# Patient Record
Sex: Male | Born: 1970 | Race: White | Hispanic: No | Marital: Married | Smoking: Never smoker
Health system: Southern US, Community
[De-identification: ages and names within clinical notes are randomized; demographics above are authoritative.]

## PROBLEM LIST (undated history)

## (undated) DIAGNOSIS — L509 Urticaria, unspecified: Secondary | ICD-10-CM

## (undated) DIAGNOSIS — K2970 Gastritis, unspecified, without bleeding: Secondary | ICD-10-CM

## (undated) DIAGNOSIS — K219 Gastro-esophageal reflux disease without esophagitis: Secondary | ICD-10-CM

## (undated) DIAGNOSIS — F419 Anxiety disorder, unspecified: Secondary | ICD-10-CM

## (undated) DIAGNOSIS — I422 Other hypertrophic cardiomyopathy: Secondary | ICD-10-CM

## (undated) HISTORY — DX: Gastro-esophageal reflux disease without esophagitis: K21.9

## (undated) HISTORY — PX: VASECTOMY: SHX75

## (undated) HISTORY — DX: Urticaria, unspecified: L50.9

## (undated) HISTORY — DX: Gastritis, unspecified, without bleeding: K29.70

## (undated) HISTORY — PX: APPENDECTOMY: SHX54

## (undated) HISTORY — DX: Anxiety disorder, unspecified: F41.9

## (undated) HISTORY — DX: Other hypertrophic cardiomyopathy: I42.2

---
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
## 2005-01-29 ENCOUNTER — Observation Stay (HOSPITAL_COMMUNITY): Admission: EM | Admit: 2005-01-29 | Discharge: 2005-01-29 | Payer: Self-pay | Admitting: Emergency Medicine

## 2005-02-04 ENCOUNTER — Encounter: Admission: RE | Admit: 2005-02-04 | Discharge: 2005-02-04 | Payer: Self-pay | Admitting: Internal Medicine

## 2005-06-06 ENCOUNTER — Ambulatory Visit (HOSPITAL_COMMUNITY): Admission: RE | Admit: 2005-06-06 | Discharge: 2005-06-06 | Payer: Self-pay | Admitting: Cardiology

## 2006-03-27 ENCOUNTER — Ambulatory Visit: Payer: Self-pay | Admitting: Cardiology

## 2006-04-09 ENCOUNTER — Ambulatory Visit: Payer: Self-pay

## 2006-04-09 ENCOUNTER — Encounter: Payer: Self-pay | Admitting: Cardiology

## 2006-04-23 ENCOUNTER — Ambulatory Visit: Payer: Self-pay | Admitting: Cardiology

## 2006-07-04 IMAGING — CT CT HEART WO/W CTA ONLY W/ CA
1 of 2 series · 7 of 20 positions shown, 9 images · IV contrast (APPLIED)
Comparison: none

CLINICAL DATA: The patient is a 37-year-old white male, who has had recurrent chest pain.  He has an abnormal ECG.  Previous stress echo study showed normal exam; however, he has continued to have symptoms of intermittent chest pain. 
CORONARY CT ANGIOGRAPHY WITH LEFT VENTRICULAR ASSESSMENT AND CALCIUM SCORING:
Preparation:  The patient was premedicated with Toprol XL 50 mg the day of the procedure.  He was given sublingual nitroglycerin x 1 prior to the scan.  His heart rate was 56 during the procedure.  
The scan was then performed.  Initial topogram was obtained.  We then performed a scan for calcium scoring.  The patient then received a 20 cc IV contrast timing bolus with sample obtained in the ascending aorta.  He then received an 80 cc bolus of contrast for CT angiography followed by saline flush.  The patient tolerated the procedure well without complications.  Scanning was performed on a Siemens Sensation 64-channel multislice CT scanner with 0.6-mm collimation and 0.4-mm image overlap reconstruction.  Data was reconstructed and viewed on [employer] workstation.  Image quality was excellent.
EXAMINATION FINDINGS:  Calcium score was zero, placing him at less than 75% for matched age and sex cohort.
Coronary angiography demonstrated normal origin of the left main coronary artery from the left coronary cusp.  The left main coronary artery was normal. 
The left anterior descending artery was a large vessel supplying the anterior wall.  It gives rise to a moderate-sized first diagonal and a somewhat smaller size second diagonal.  These branches were all normal with no evidence of atherosclerotic disease.  There is a segment of the mid LAD between the first and second diagonal branches that is intramyocardial.  
The left circumflex coronary artery gives rise to a high takeoff of a first marginal branch, which is a moderate to large vessel.  The circumflex then continues on the lateral wall to a second obtuse marginal vessel.  These branches are normal with no evidence of atherosclerotic disease. 
The right coronary artery arises normally from the right coronary cusp.  It is a very large and dominant vessel.  It has a high takeoff of the PDA, which courses to the AV groove.  The right coronary artery then terminates in smaller posterolateral branches.  The right coronary artery throughout its course and branches is normal without atherosclerotic disease noted. 
Left ventricular analysis was performed.  This demonstrates normal left ventricular size, wall thickness, and contractility with no regional wall motion abnormalities.  End-diastolic volume is 140 cc, and systolic volume is 47 cc with an ejection fraction of 67%.

[Series 6: soft tissue · axial · 0.62mm/px · z∈[+1151,+1250]mm · 7 of 45 slices shown, 9 images]
[im 6/45  vessel]
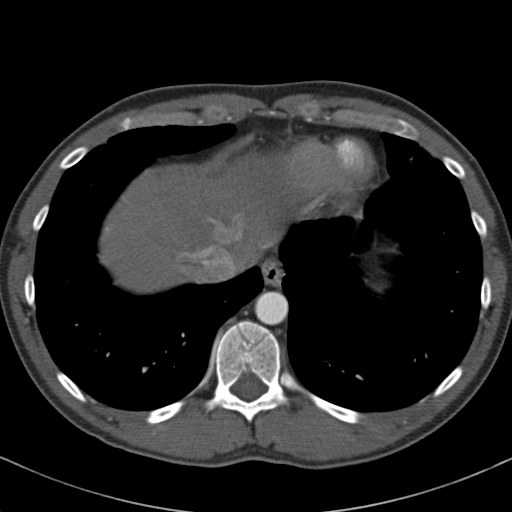
[im 6/45  lung]
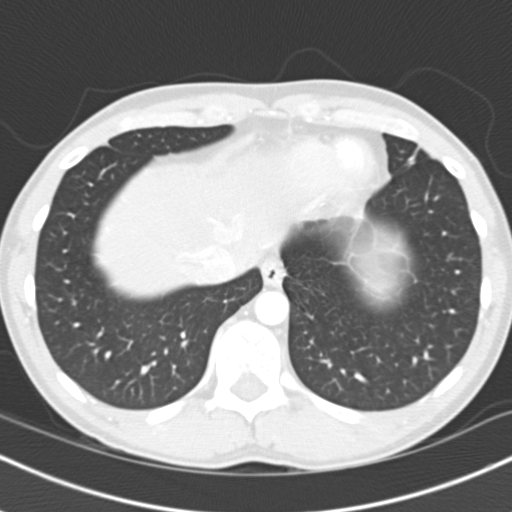
[im 12/45  vessel]
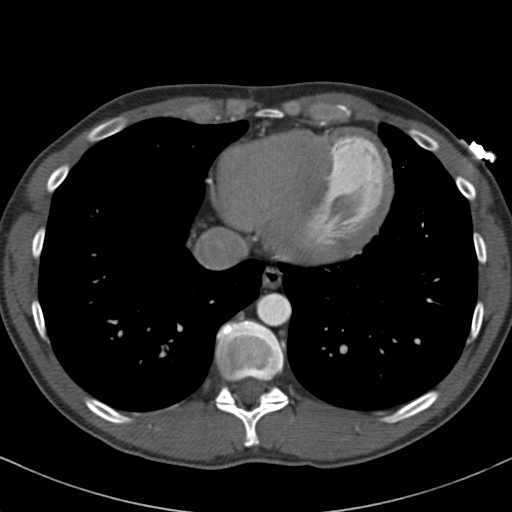
[im 17/45  vessel]
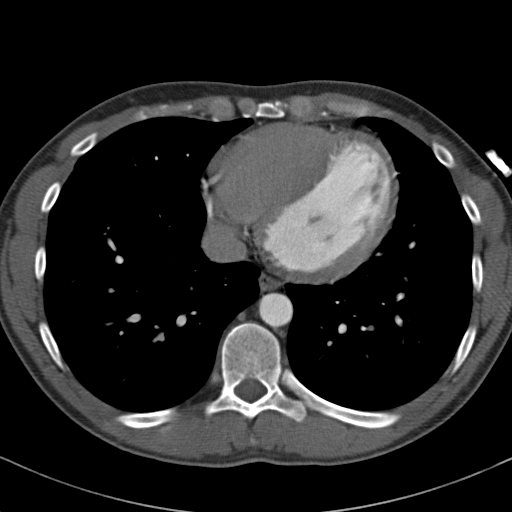
[im 23/45  vessel]
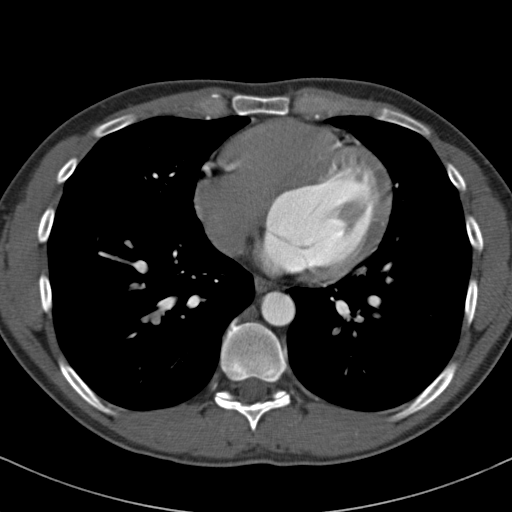
[im 28/45  vessel]
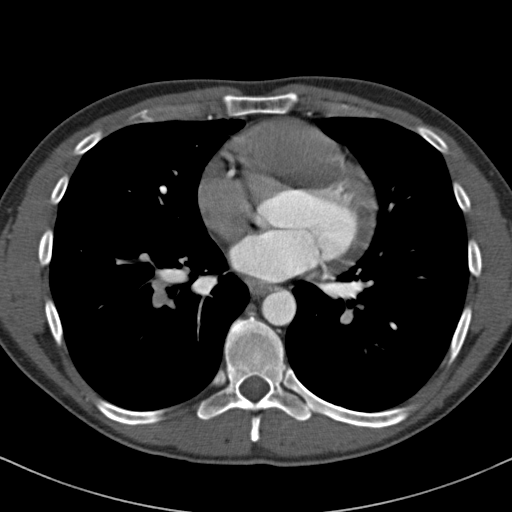
[im 28/45  lung]
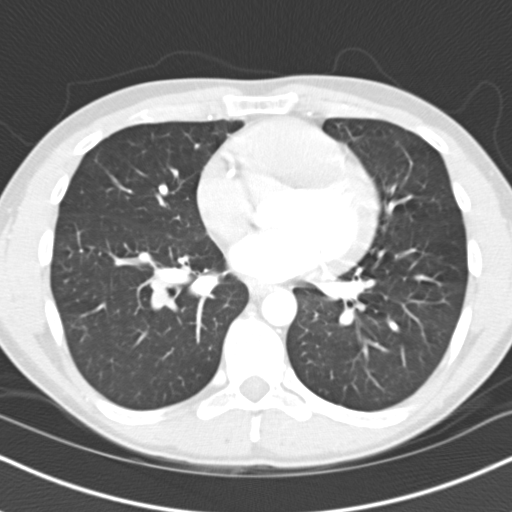
[im 34/45  vessel]
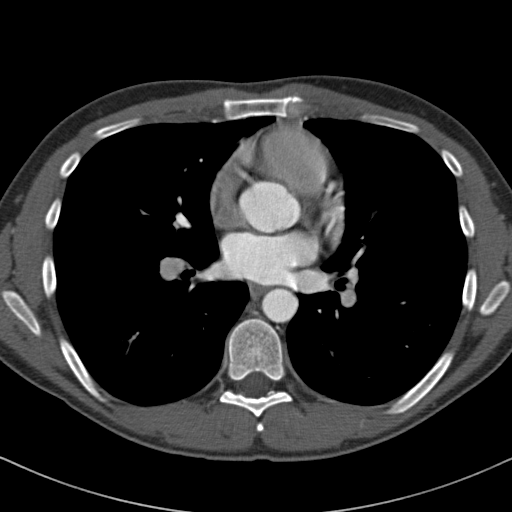
[im 39/45  vessel]
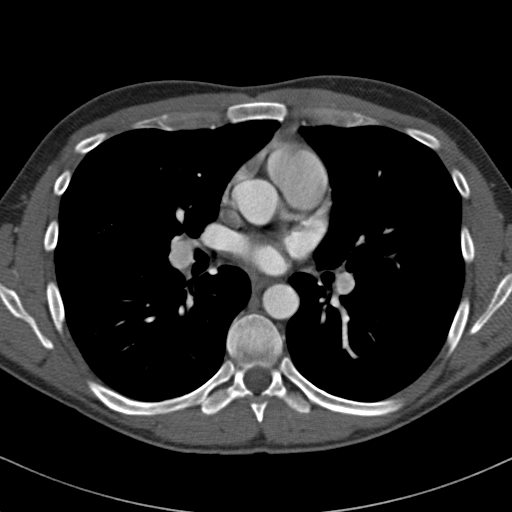

[7 of 20 positions shown; findings below may reference images not displayed]

IMPRESSION: 1.  Normal coronary anatomy with no evidence of atherosclerotic disease. 
2.  Intramyocardial segment of the mid LAD.
3.  Calcium score of zero. 
4.  Normal left ventricular function. 
5.   No significant extracardiac findings.
The study was performed by Sylfrain. Fraisse, Peeta, and David Freddy Nv.
[REDACTED] to Hohner, using codes 148T and 151T.

## 2006-08-25 ENCOUNTER — Ambulatory Visit: Payer: Self-pay | Admitting: Cardiology

## 2006-09-02 ENCOUNTER — Ambulatory Visit: Payer: Self-pay | Admitting: Gastroenterology

## 2006-09-02 LAB — CONVERTED CEMR LAB
AST: 26 units/L (ref 0–37)
Alkaline Phosphatase: 56 units/L (ref 39–117)
Basophils Absolute: 0 10*3/uL (ref 0.0–0.1)
Basophils Relative: 0.7 % (ref 0.0–1.0)
Bilirubin, Direct: 0.1 mg/dL (ref 0.0–0.3)
CO2: 31 meq/L (ref 19–32)
Calcium: 9.6 mg/dL (ref 8.4–10.5)
Chloride: 102 meq/L (ref 96–112)
GFR calc non Af Amer: 101 mL/min
Glucose, Bld: 97 mg/dL (ref 70–99)
HCT: 42.1 % (ref 39.0–52.0)
Lipase: 34 units/L (ref 11.0–59.0)
MCV: 88.5 fL (ref 78.0–100.0)
Monocytes Relative: 8.5 % (ref 3.0–11.0)
Neutrophils Relative %: 61.1 % (ref 43.0–77.0)
Platelets: 229 10*3/uL (ref 150–400)
Potassium: 3.9 meq/L (ref 3.5–5.1)
RBC: 4.76 M/uL (ref 4.22–5.81)
RDW: 11.9 % (ref 11.5–14.6)
Saturation Ratios: 15.7 % — ABNORMAL LOW (ref 20.0–50.0)
Sed Rate: 13 mm/hr (ref 0–20)
Sodium: 141 meq/L (ref 135–145)
Total Bilirubin: 0.6 mg/dL (ref 0.3–1.2)
WBC: 5 10*3/uL (ref 4.5–10.5)

## 2006-09-12 ENCOUNTER — Ambulatory Visit (HOSPITAL_COMMUNITY): Admission: RE | Admit: 2006-09-12 | Discharge: 2006-09-12 | Payer: Self-pay | Admitting: Gastroenterology

## 2006-09-15 ENCOUNTER — Encounter: Payer: Self-pay | Admitting: Gastroenterology

## 2006-09-15 ENCOUNTER — Ambulatory Visit: Payer: Self-pay | Admitting: Gastroenterology

## 2006-09-15 DIAGNOSIS — K2990 Gastroduodenitis, unspecified, without bleeding: Secondary | ICD-10-CM

## 2006-09-15 DIAGNOSIS — K2970 Gastritis, unspecified, without bleeding: Secondary | ICD-10-CM | POA: Insufficient documentation

## 2007-04-10 DIAGNOSIS — I498 Other specified cardiac arrhythmias: Secondary | ICD-10-CM

## 2007-05-05 ENCOUNTER — Ambulatory Visit: Payer: Self-pay | Admitting: Cardiology

## 2007-05-18 ENCOUNTER — Ambulatory Visit: Payer: Self-pay

## 2007-05-18 ENCOUNTER — Encounter: Payer: Self-pay | Admitting: Cardiology

## 2007-10-10 IMAGING — US US ABDOMEN COMPLETE
1 series · 14 of 25 positions shown · non-contrast
Comparison: None.

CLINICAL DATA: Abdominal pain.
 ABDOMEN ULTRASOUND:
TECHNIQUE: Complete abdominal ultrasound examination was performed including evaluation of the liver, gallbladder, bile ducts, pancreas, kidneys, spleen, IVC, and abdominal aorta.

[Series 1: unknown · 0.33mm/px · 14 of 59 slices shown]
[im 1/59]
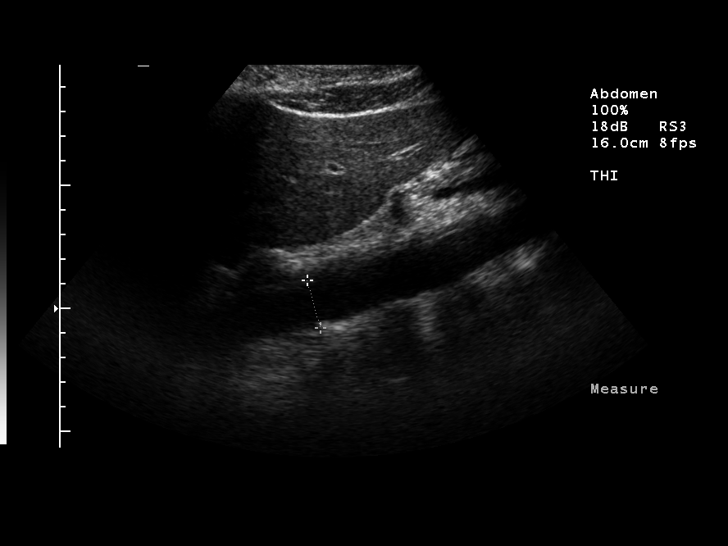
[im 5/59]
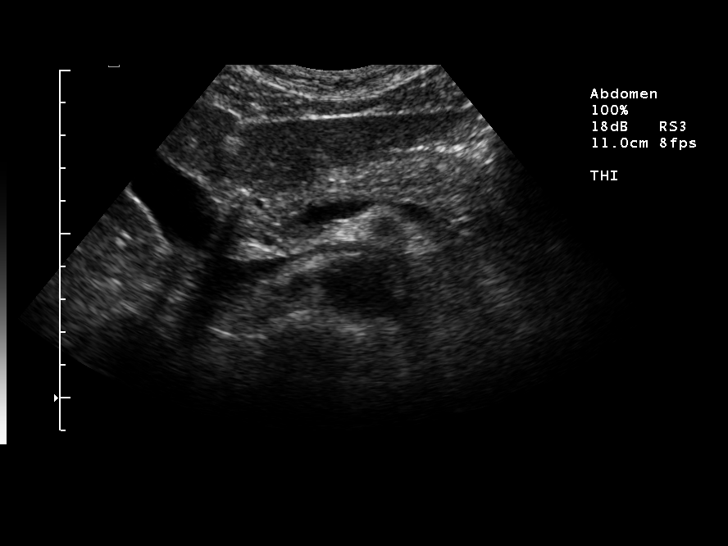
[im 10/59]
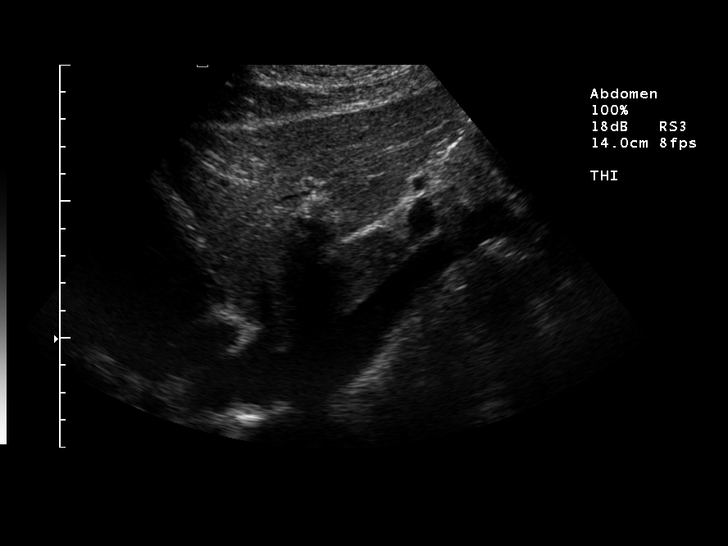
[im 15/59]
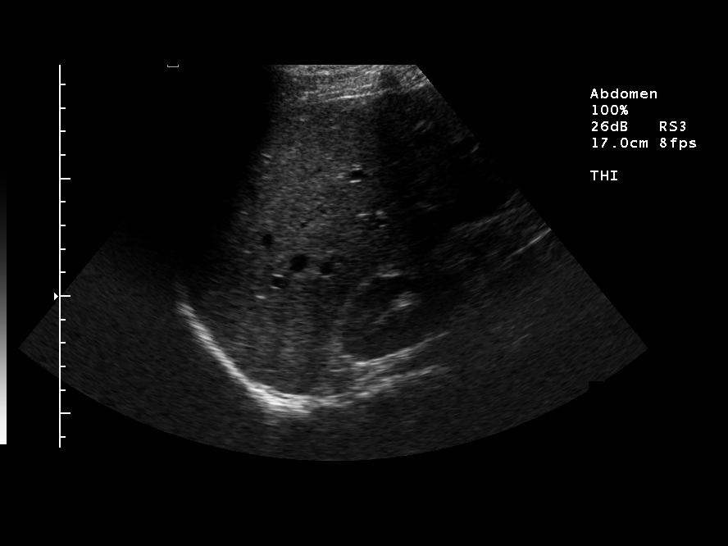
[im 20/59]
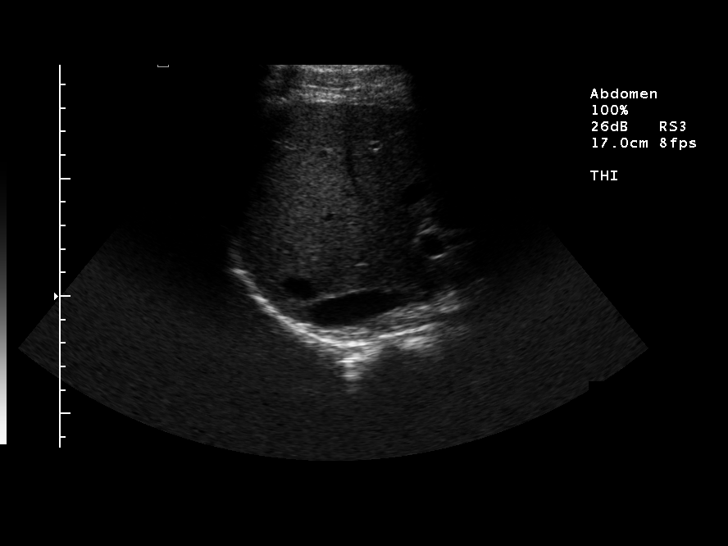
[im 22/59]
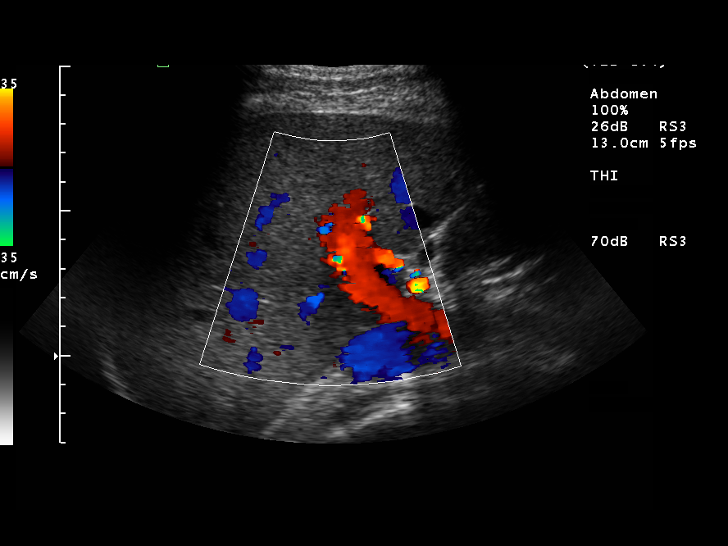
[im 27/59]
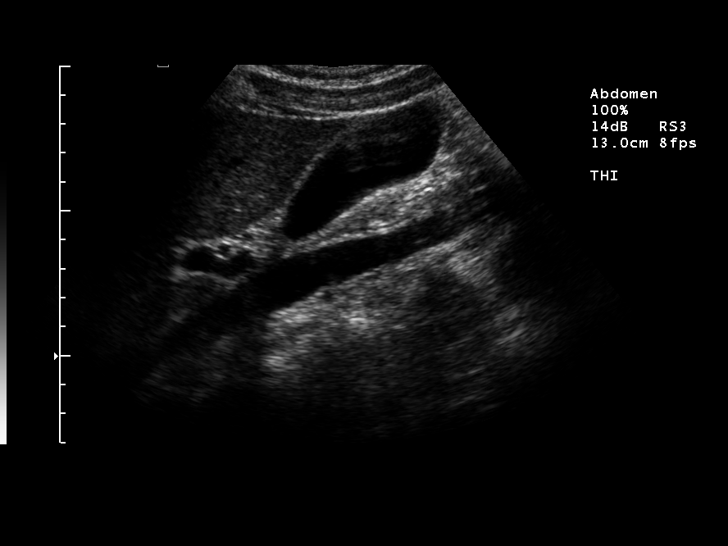
[im 32/59]
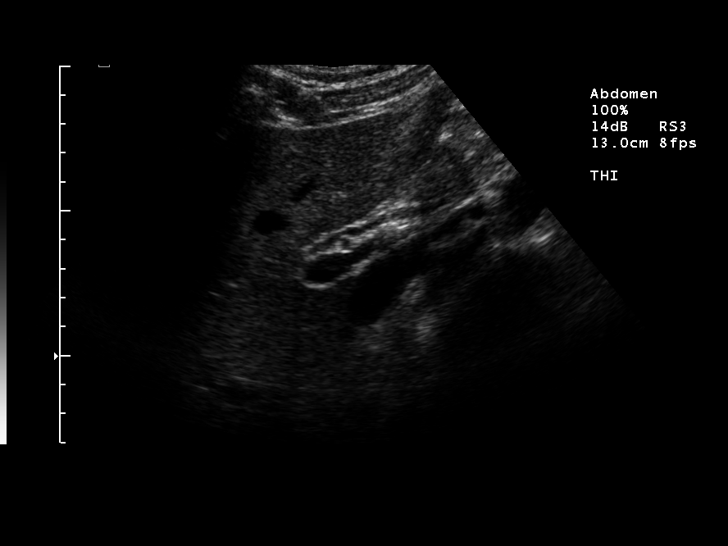
[im 37/59]
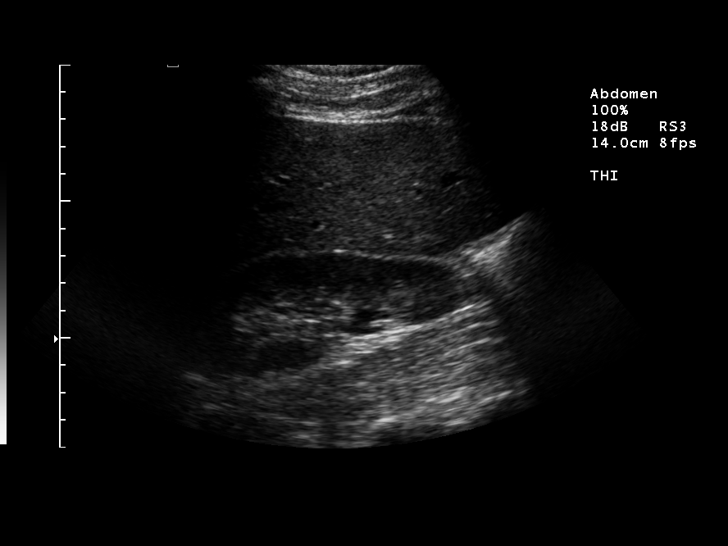
[im 39/59]
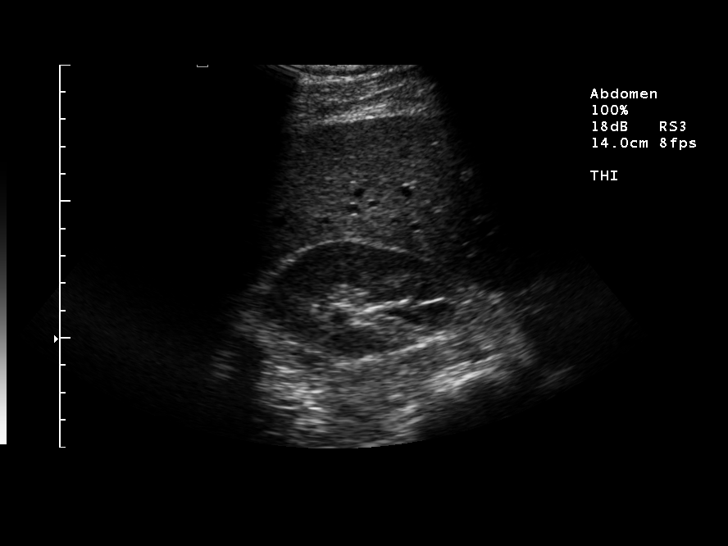
[im 44/59]
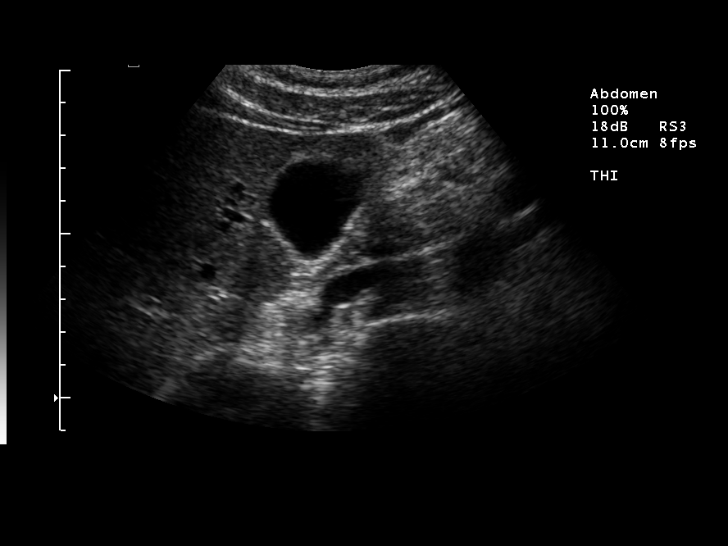
[im 49/59]
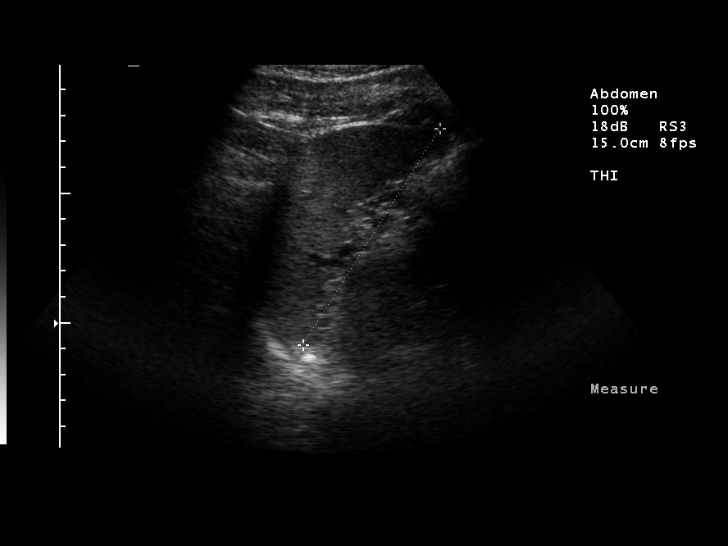
[im 54/59]
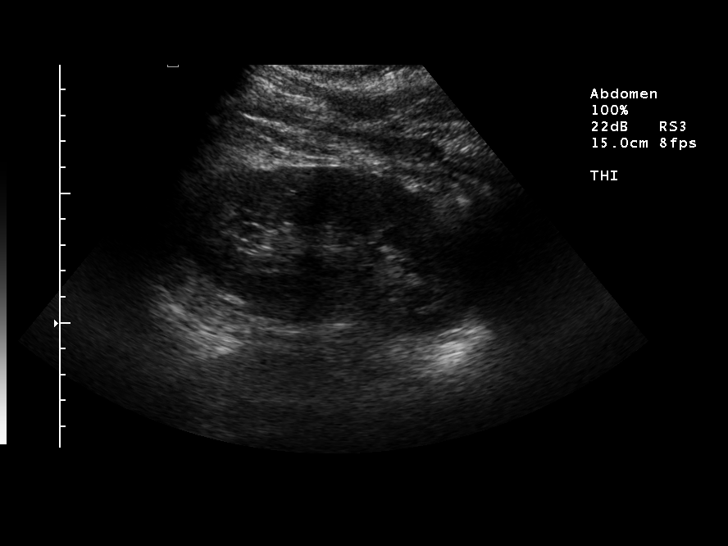
[im 59/59]
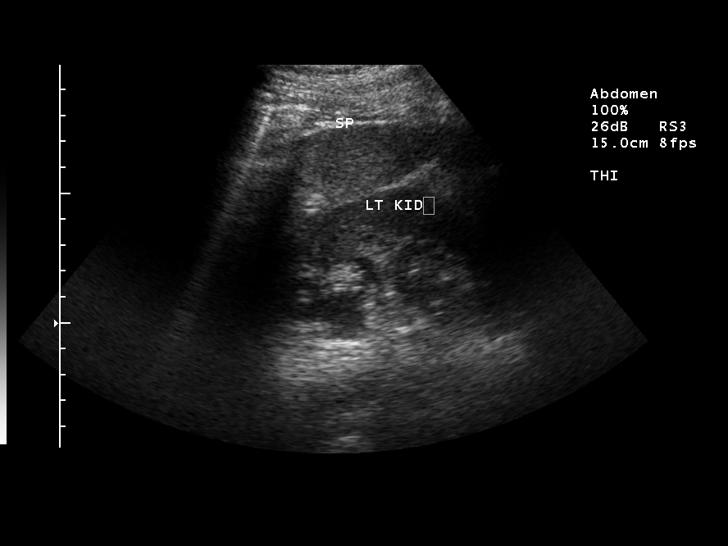

[14 of 25 positions shown; findings below may reference images not displayed]

FINDINGS: The liver, gallbladder, extrahepatic bile duct, IVC, pancreas, spleen, kidneys and aorta are unremarkable.
IMPRESSION: No acute findings.

## 2008-05-05 ENCOUNTER — Ambulatory Visit: Payer: Self-pay | Admitting: Cardiology

## 2008-05-05 DIAGNOSIS — I422 Other hypertrophic cardiomyopathy: Secondary | ICD-10-CM

## 2008-05-20 ENCOUNTER — Encounter: Payer: Self-pay | Admitting: Cardiology

## 2008-05-20 ENCOUNTER — Ambulatory Visit: Payer: Self-pay

## 2009-03-22 ENCOUNTER — Telehealth: Payer: Self-pay | Admitting: Cardiology

## 2009-04-03 ENCOUNTER — Telehealth (INDEPENDENT_AMBULATORY_CARE_PROVIDER_SITE_OTHER): Payer: Self-pay | Admitting: *Deleted

## 2009-04-07 ENCOUNTER — Ambulatory Visit: Payer: Self-pay | Admitting: Cardiology

## 2009-05-09 DIAGNOSIS — I2510 Atherosclerotic heart disease of native coronary artery without angina pectoris: Secondary | ICD-10-CM

## 2009-05-10 ENCOUNTER — Ambulatory Visit: Payer: Self-pay | Admitting: Cardiology

## 2009-05-10 ENCOUNTER — Ambulatory Visit: Payer: Self-pay | Admitting: Cardiovascular Disease

## 2009-05-10 ENCOUNTER — Ambulatory Visit (HOSPITAL_COMMUNITY): Admission: RE | Admit: 2009-05-10 | Discharge: 2009-05-10 | Payer: Self-pay | Admitting: Cardiology

## 2009-05-10 ENCOUNTER — Encounter: Payer: Self-pay | Admitting: Cardiology

## 2009-05-10 ENCOUNTER — Ambulatory Visit: Payer: Self-pay

## 2010-01-28 ENCOUNTER — Encounter: Payer: Self-pay | Admitting: Gastroenterology

## 2010-02-06 NOTE — Progress Notes (Signed)
  Walk in Patient Form Recieved " Pt's Daughter dropped off Medical Records for Mother" will place in the Scan area  [facility]  April 03, 2009 4:17 PM

## 2010-02-06 NOTE — Assessment & Plan Note (Signed)
Summary: 1 YR FU   Visit Type:  Follow-up Primary Provider:  Rodrigo Ran  CC:  no complaints ? about hereditary problems.  History of Present Illness: Martin Pearson returns today for evaluation and management of his nonobstructive hypertrophic cardiomyopathy.  He is having no symptoms of chest pain, dyspnea on exertion, syncope, presyncope, or palpitations. He still exercises on a regular basis.  His daughter has been diagnosed with the same condition. She is undergoing genetic testing to Dr. Meredeth Ide at [facility].  Current Medications (verified): 1)  Nexium 40 Mg Cpdr (Esomeprazole Magnesium) .... As Needed 2)  Multivitamins   Tabs (Multiple Vitamin) .Marland Kitchen.. 1 Tab Weekly  Allergies (verified): No Known Drug Allergies  Past History:  Past Medical History: Last updated: 05/03/2008 CHEST PAIN-UNSPECIFIED (ICD-786.50) ATRIAL ARRHYTHMIAS, CHRONIC (ICD-427.89) GASTRITIS (ICD-535.50)    Past Surgical History: Last updated: 05/03/2008 noncontributory  Family History: Last updated: 05/03/2008 Family History of Cancer: Father..prostate ca  Social History: Last updated: 05/03/2008 Full Time Regular Exercise - yes Drug Use - no Tobacco Use - Former. quit 1994  Married  Alcohol Use - yes..10-12 per week  Risk Factors: Exercise: yes (05/03/2008)  Risk Factors: Smoking Status: quit (05/03/2008)  Review of Systems       negative other than history of present illness  Vital Signs:  Patient profile:   40 year old male Height:      72 inches Weight:      180 pounds BMI:     24.50 Pulse rate:   52 / minute BP sitting:   124 / 70  (left arm) Cuff size:   regular  Vitals Entered By: Hardin Negus, RMA (April 07, 2009 10:18 AM)  Physical Exam  General:  Well developed, well nourished, in no acute distress. Head:  normocephalic and atraumatic Eyes:  PERRLA/EOM intact; conjunctiva and lids normal. Neck:  Neck supple, no JVD. No masses, thyromegaly or abnormal cervical  nodes. Chest Martin Pearson:  no deformities or breast masses noted Lungs:  Clear bilaterally to auscultation and percussion. Heart:  Non-displaced PMI, chest non-tender; regular rate and rhythm, S1, S2 without murmurs, rubs or gallops. Carotid upstroke normal, no bruit. Normal abdominal aortic size, no bruits. Femorals normal pulses, no bruits. Pedals normal pulses. No edema, no varicosities. Msk:  Back normal, normal gait. Muscle strength and tone normal. Pulses:  pulses normal in all 4 extremities Extremities:  No clubbing or cyanosis. Neurologic:  Alert and oriented x 3. Skin:  Intact without lesions or rashes. Psych:  Normal affect.   EKG  Procedure date:  04/07/2009  Findings:      sinus bradycardia with sinus arrhythmia, rightward axis, ST segment changes inferior laterally, no change from previous ECG.  Impression & Recommendations:  Problem # 1:  HYPERTROPHIC CARDIOMYOPATHY (ICD-425.1) I will arrange an exercise treadmill as well as a 2-D echocardiogram. Indications explained to the patient. This will be an annual assessment for risk stratification Orders: Echocardiogram (Echo)  Problem # 2:  CHEST PAIN-UNSPECIFIED (ICD-786.50) Assessment: Improved  Other Orders: EKG w/ Interpretation (93000) Treadmill (Treadmill)  Patient Instructions: 1)  Your physician recommends that you schedule a follow-up appointment in: YEAR WITH DR Franchesca Veneziano 2)  Your physician recommends that you continue on your current medications as directed. Please refer to the Current Medication list given to you today. 3)  Your physician has requested that you have an echocardiogram.  Echocardiography is a painless test that uses sound waves to create images of your heart. It provides your doctor with information about  the size and shape of your heart and how well your heart's chambers and valves are working.  This procedure takes approximately one hour. There are no restrictions for this procedure. 4)  Your physician  has requested that you have an exercise tolerance test.  For further information please visit [email or web address]  Please also follow instruction sheet, as given.

## 2010-02-06 NOTE — Progress Notes (Signed)
Summary: general question -   Phone Note Call from Patient Call back at Home Phone [phone] Call back at [phone]-cell phone   Caller: Patient Reason for Call: Talk to Nurse Details for Reason: per pt calling, has some question regarding his condition, daughter went to dr.fle[facility] office # [phone]. and was told she has the same thing her father has. would like for christine to call him or dr. Daelon Dunivan to discuss.  Initial call taken by: Lorne Skeens,  March 22, 2009 4:00 PM  Follow-up for Phone Call        spoke w/pt, his 40 year old daughter is seeing Dr Meredeth Ide at [facility] and has same type of cardiomyopathy as him, and she is undergoing a lot of test.  Pt wanted to know if Dr Daleen Squibb could call and discuss w/him or Dr Meredeth Ide, will send mess to [facility] and Dr Erlinda Hong, RN  March 22, 2009 4:29 PM   Additional Follow-up for Phone Call Additional follow up Details #1::        I will call. Additional Follow-up by: Gaylord Shih, MD, [facility],  March 27, 2009 11:08 AM     Appended Document: general question -  PER DR Bannie Lobban SPOKE WITH DR FLEMING./CY

## 2010-05-22 NOTE — Assessment & Plan Note (Signed)
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 His  primary care physician is Dr.  Rodrigo Ran.   Cletis Athens has a long involved history of seeing multiple physicians over the  last couple of years for problems with recurrent urticaria and  unexplained non-cardiac chest pain. He comes to my office today because  of recurrent episodes over the last year of rather severe subxiphoid  chest pain described as a dull aching sensation followed shortly  thereafter by rather severe nausea and vomiting and dry heaves. Shortly  thereafter, the patient will have rather marked diarrhea and become  further dehydrated. Most of these episodes seem to be directly related  to excessive consumption of alcohol. He has had no documented episodes  of pancreatitis, hepatitis or known gallbladder disease.   The patient has had loose stools for many years and has traveled  exclusively around the world to [place] countries. I doubt that he  has had ova and parasite examinations done. He has had some anorexia and  weight loss of 10 pounds over the last year. He has had some nonspecific  general malaise. He denies true chronic reflux symptoms and does not  take frequent antacids. His loose stools are described as liquid at 4-5  a day without melena or hematochezia. He has had associated urticaria.  At times has what sounds like possible erythema nodosum. He has  apparently seen Dr.  Daleen Squibb in Cardiology, Dr.  Melbourne Abts in Neurology, Dr.  Estill Bakes in Rheumatology, and Dr.  Huntley Dec who is an allergist at  [facility]. He has had thorough workups, but has apparently an  elevated CRP and  an elevated total IgA level. His cardiac workup is in  our chart and has been extensive. In addition to our cardiologists he  also saw Dr.  Peter Swaziland.  Last lab data in our chart from 2004 and  was unremarkable as was chest x-ray. The patient does have an abnormal  EKG which has been persistent and consistent with elevated ST segments  and right ventricular hypertrophy.   Jon follows a fairly regular diet and denies any specific food  intolerances. He has no dysphagia, clay colored stools or dark urine,  icterus, fevers, or chills. He has never had GI x-rays or ultrasound  examinations. He specifically denies abuse of sorbitol, fructose or  lactose intolerance. A lot of his problems do seem to be directly  related to high anxiety state which he has had over the last several  years from multiple sources.   PAST MEDICAL HISTORY:  1. As mentioned above, the patient has a chronic arrhythmia problem.  2. Chronic recurrent chest pain, apparently was again recently      hospitalized and had a negative cardiac workup.  3. He has  never had abdominal surgical procedures.   MEDICATIONS:  Multivitamins without any other medications.   ALLERGIES:  Denies drug allergies.   FAMILY HISTORY:  Noncontributory except for his father with prostate  cancer. He has a brother who recently saw me and was treated for H-  pylori gastritis who had a similar problem with upper abdominal pain.  Treatment for H-pylori alleviated all of his chronic symptomatology.   SOCIAL HISTORY:  The patient is married and lives with his wife and  family. 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 He does not smoke, but uses ethanol rather heavily, mostly  with binge drinking on weekends. Most of his GI complaints are closely  related to his drinking.   PHYSICAL EXAMINATION:  He is a healthy-appearing white male in no  distress, appearing his stated age. He is 6 feet tall and weighs 169   pounds. Blood pressure 118/68, pulse 80 and regular.  I could not appreciate stigmata of chronic liver disease and there was  no thyromegaly or lymphadenopathy.  CHEST: Was clear to percussion and auscultation.  He is in a regular rhythm without significant murmurs, gallops or rubs.  I could not appreciate hepatosplenomegaly, abdominal masses or  tenderness. Bowel sounds were normal.  Peripheral extremities were unremarkable.  Inspection of the rectum was unremarkable as was rectal examination  without masses or tenderness. Stool was guaiac negative.  Mental status was clear.   ASSESSMENT:  1. Recurrent upper abdominal/epigastric/subxyphoid pain with nausea      and vomiting, rule out acute relapsing pancreatitis secondary to      ethanol abuse, rule out cholelithiasis.  2. Chronic diarrhea with weight loss, rule out chronic intestinal      parasites or chronic malabsorption syndrome.  3. History of recurrent non-cardiac chest pain, perhaps related to      chronic gastroesophageal reflux disease.  4. Chronic urticaria of unexplained etiology with history of elevated      IgA level. Rule out celiac disease.  5. History of numbness of his left hand and right leg of questionable      etiology with apparently negative neurology evaluations including      nerve stimulation tests by Dr.  Sandria Manly.   RECOMMENDATIONS:  1. Outpatient endoscopy and ultrasonography with small bowel biopsy.      At the time of his endoscopy will also will check him Helicobacter      pylori infection and any type of eosinophilic gastroenteritis.  2. Repeat laboratory testing including celiac panel, CDT level to      document extensive alcohol abuse.  3. Amylase, lipase, serum trypsin level, metabolic profile for our      records.  4. Stool examinations for ova and parasites and Giardia.  5. I have asked the patient to have strict avoidance of alcohol while      his workup is being completed.  6. Empirically  placed him on Nexium 40 mg a day with standard      antireflux maneuvers.  7. Further workup depending on his clinic course and laboratory      evaluations.     Vania Rea. Jarold Motto, MD, [facility], FAGA  Electronically Signed    DRP/MedQ  DD: 09/02/2006  DT: 09/02/2006  Job #: [id number]   cc:   Loraine Leriche A. Perini, M.D.  Thomas C. Daleen Squibb, MD, [facility]  Genene Churn. Love, M.D.  Areatha Keas, M.D.

## 2010-05-22 NOTE — Assessment & Plan Note (Signed)
[facility]                            CARDIOLOGY OFFICE NOTE   NAME:Rennie, KASEY EWINGS                      MRN:          [id number]  DATE:05/05/2007                            DOB:          August 16, 1970    Uthman comes in today for followup.  Please refer to my previous  notes.   He is having no further chest pain and remains extremely active.  He has  no limitations.  He is really getting into golf!   His EKG remains abnormal, and we repeated it today, with no changes.  He  may have minimal hypertrophic obstructive cardiomyopathy.  Please see my  previous notes.   His last stress echocardiogram was in 2005 by Dr. Peter Swaziland.  He has  had a previous cardiac CT in May 2007 which showed normal coronary  arteries, no calcification, normal LV function.  Again, a 2D  echocardiogram showed a question of some LVH.  He had a minimal amount  of asymmetric septal hypertrophy, with a septum of 11 mm, posterior wall  8 mm.   He is on no medications.   PHYSICAL EXAMINATION:  VITAL SIGNS:  His blood pressure today is 116/68,  his pulse 63 and regular.  His weight is 172.  HEENT:  Unchanged.  NECK:  Carotids are full, without bruits.  No JVD.  Thyroid is not  enlarged.  Trachea is midline.  LUNGS:  Clear.  HEART:  Reveals a regular rate and rhythm.  No murmur.  No gallop.  ABDOMEN:  Soft.  EXTREMITIES:  No edema.  Pulses are intact.   I had a long talk with Jaiel today.  I am delighted that he is  asymptomatic and has more confidence in not being as concerned about his  chest discomfort.  We will go ahead and do an exercise rest/stress  echocardiogram as planned from our previous visit.  We will also get a  __________ and look at his septum.   Assuming this is negative, I will see him back in a year.     Thomas C. Daleen Squibb, MD, [facility]  Electronically Signed    TCW/MedQ  DD: 05/05/2007  DT: 05/05/2007  Job #: [id number]

## 2010-05-22 NOTE — Assessment & Plan Note (Signed)
[facility]                            CARDIOLOGY OFFICE NOTE   NAME:BELLShahan, Starks                      MRN:          [id number]  DATE:08/25/2006                            DOB:          03/27/1970    Mr. Martin Pearson comes in today for further atypical chest pain, intermittent  left arm and left leg numbness, which he has had off and on for quite  some time, and just not feeling right.  He has also had episodes where  he felt like his chest was shocked, so called a myoclonic jerk.   He denies any presyncope or syncope.  He has had no ischemic symptoms.  He still runs and exercises on a regular basis.  His exercise is not  limited.  He notices a fair amount of variation in his heart rate, going  up as high as 170 at peak exercise, which is a little less than his 100%  maximum heart rate, going down to about 110 for a few minutes at rest,  and then dropping back down to his resting pulse in the 50s.  He says  that one night when he woke up he checked his resting heart rate, and it  was running about 40 beats per minute.  Again, asymptomatic.   He has had an extensive evaluation in the past, which we reviewed at  length today.  This has included a negative stress echo in 2005 with Dr.  Peter Swaziland, cardiac CT Jun 06, 2005, which was normal with normal  coronary arteries, no calcification, normal LV function.  There was 1  intramyocardial segment in the LAD, which was not felt to be clinically  relevant.  We performed a 2D echocardiogram here to follow up on a  question of some LVH.  This demonstrated a minimal amount of asymmetric  septal hypertrophy with interventricular septum of 11 mm of mercury,  left ventricular posterior wall of 8 mm of mercury.   His blood pressure today is 116/68.  His pulse 63 and regular.  His  weight is 172.  HEART:  Reveals a normal S1 and S2 with a very soft systolic murmur  along the left sternal border.   EKG was repeated  today, which showed again ST segment changes  inferolaterally, which may be a little more pronounced today than  before.  He has some ST segment changes anteriorly, which are slightly  more pronounced than before.  However, looking back at his EKGs in the  past, EKG in May 2004 showed similar changes.   ASSESSMENT AND PLAN:  I had a long talk with Erick today.  As I told  him, we have no explanation for many of his symptoms.  Some of the chest  discomfort and dizziness, I have told him, could be related to mild  HOCM.  Whether intramyocardial left anterior descending bridge has any  bearing whatsoever here is hard to say.   I have given him reassurance again today.  I have asked him to return  back in March 2009, at which time we will do an exercise rest/stress  echo.     Maisie Fus C. Daleen Squibb, MD, [facility]  Electronically Signed    TCW/MedQ  DD: 08/25/2006  DT: 08/26/2006  Job #: [id number]   cc:   Loraine Leriche A. Perini, M.D.

## 2010-05-25 NOTE — Consult Note (Signed)
Martin Pearson, Martin Pearson               ACCOUNT NO.:  [id number]   MEDICAL RECORD NO.:  [id number]          PATIENT TYPE:  INP   LOCATION:  3713                         FACILITY:  [facility]   PHYSICIAN:  Mark A. Perini, M.D.   DATE OF BIRTH:  July 01, 1970   DATE OF CONSULTATION:  01/29/2005  DATE OF DISCHARGE:                                   CONSULTATION   CHIEF COMPLAINT:  Chest pain and left arm tingling.   HISTORY OF PRESENT ILLNESS:  Martin Pearson is a pleasant 40 year old male who is  generally healthy.  He was in his usual state of health until he woke at  about 1:30 in the morning today with a tight sensation in his chest.  This  was rated at 0 to 1/10 in severity.  He also felt some numbness and tingling  in his left arm.  Due to these symptoms, he presented to the emergency room.  He has  a known history of an abnormal EKG with T-wave inversions throughout  the inferolateral leads.  This was seen in the ER and due to this abnormal  EKG, it was felt that he should be kept for further observation.  He did  feel a little bit anxious at the time of presentation to the ER. He had less  than 30 minutes of this chest tightness feeling.  No medicines were given to  alleviate it as it simply went away on its own.  He does report some reflux  type symptoms in the last few days and does occasionally have this.  He  denies any nausea, vomiting, diaphoresis or shortness of breath.  There has  been no other radiation of symptoms. At this time, he still has a little bit  of numbness or tingling feeling in his left upper extremity.   PAST MEDICAL HISTORY:  Essentially negative.  No history of any significant  surgeries.  In November of 2005, he had an EKG for an insurance physical  which showed abnormalities of his ST and T-waves throughout the  inferolateral leads.  He was evaluated at [facility] with an  echocardiogram  and an exercise treadmill with no worrisome finding found.  He has no  history of hypertension or hyperlipidemia or diabetes.   ALLERGIES:  He is most likely allergic to SUNSCREEN PABA.   MEDICATIONS:  He recently was given a steroid shot and dexamethasone within  the last week for a possible allergic reaction to sunscreen and he was also  given some Atarax which he has not really needed.  He takes no other chronic  medications.   SOCIAL HISTORY:  He is married.  He has two children and one child on the  way.  He does not smoke.  He drinks 10 to 15 beers a week.  No drug use.   FAMILY HISTORY:  Father is living, had a history of prostate cancer with no  evidence of disease currently and hyperlipidemia.  Mother is alive with  hypothyroidism.  There is no history of early coronary disease or diabetes  in the family and he has three living  grandparents who are all over 29 years  old.   REVIEW OF SYSTEMS:  Notable for a trip to the [place] in the last week.  He applied some sunscreen and started to have itching.  He had an allergic  reaction to sunscreen within the last year.  He therefore presented to a  doctor in the [place] and was given a steroid shot as well as some  dexamethasone and Atarax.  This averted any sort of rash.  Otherwise he has  been feeling well lately.  He does work in a stressful job.   PHYSICAL EXAMINATION:  GENERAL APPEARANCE:  He is in no acute distress.  Well-developed, well-nourished, age-appropriate male.  VITAL SIGNS:  Temperature 97.8, blood pressure 133/81, pulse 78, respiratory  rate 20, oxygen saturation 100%.  LUNGS:  Clear to auscultation bilaterally with no wheezing, rhonchi or  rales.  CARDIOVASCULAR:  Regular rate and rhythm with no murmurs, rubs, or gallops.  There is no JVD.  ABDOMEN:  Soft and nontender, nondistended.  EXTREMITIES:  There is no edema.  NEUROLOGIC:  The patient is neurologically intact.   LABORATORY DATA:  Sodium 140, potassium 3.4, chloride 104, CO2 27, BUN 18,  creatinine 1.1, glucose 107.   LFTs are normal.  White count 8.5 with normal  differential, hemoglobin 14.9, platelet count 261,000.  Troponin I 0.03, CK-  MB is 1.4, CK is 106; that is the second set.  First set of enzymes showed  troponin I of less than 0.05, myoglobin of 56, CK-MB 1.4.   EKG shows normal sinus rhythm with an incomplete right bundle branch block  pattern and inferolateral T-wave inversions.   ASSESSMENT/PLAN:  A 40 year old male with known abnormal EKG and has had an  essentially negative work-up for this within the last year and a half.  He  has no chest pain symptoms whatsoever at this time.  He has had two sets of  negative cardiac enzymes.  I think he has some gastroesophageal reflux given  his recent history and symptoms of reflux he had within the last 24 hours.  He was also given a recent steroid shot and dexamethasone and it is known  that corticosteroids can sometimes exacerbate reflux problems or gastritis.   We will treat him with oral Protonix.  Will discharge him from the hospital  and have him follow up with cardiology in the next one to two days for any  further testing as per cardiologist's discretion.  He is to call if he has  any recurrent problems.           ______________________________  Redge Gainer Waynard Edwards, M.D.     MAP/MEDQ  D:  01/29/2005  T:  01/29/2005  Job:  [id number]   cc:   Peter M. Swaziland, M.D.  Fax: [phone]

## 2010-05-25 NOTE — Assessment & Plan Note (Signed)
[facility]                            CARDIOLOGY OFFICE NOTE   NAME:Grieb, KEONDRICK DILKS                      MRN:          [id number]  DATE:04/23/2006                            DOB:          1970-02-11    Erastus comes in today for followup of his atypical chest pain and  occasional dizziness and dyspnea.  He also presented with an abnormal  EKG.   We performed a 2D echocardiogram to see if he had any significant LVH or  asymmetric septal hypertrophy.  His septum measured 11 mm, upper limits  of normal; posterior wall 8 mm.  There was no outflow tract gradient.  His LV function overall was normal, EF 55-60%.  He had mildly elevated  peak pulmonary artery systolic pressures, but normal right-sided  structures.   I have called Austen with these results on the phone.  He comes in  today with several other questions.   His blood pressure today is 130/78, his pulse is 57 and regular, his  weight is 179.   I spent about twenty minutes-plus talking to Anush about the  possibility of this being early asymmetric septal hypertrophy, or HOCM.  If it is, I made it clear to him that he has had this all his life.  It  is fairly subtle at present and we need it to be monitored.  It could  explain some of his symptomatology.   PLAN:  1. Continue current lifestyle, including regular exercise.  2. Reassurance.  3. Followup with me in a year with annual echoes.     Thomas C. Daleen Squibb, MD, [facility]  Electronically Signed    TCW/MedQ  DD: 04/23/2006  DT: 04/23/2006  Job #: [id number]   cc:   Loraine Leriche A. Perini, M.D.

## 2010-05-25 NOTE — Assessment & Plan Note (Signed)
[facility]                            CARDIOLOGY OFFICE NOTE   NAME:Martin Pearson                      MRN:          [id number]  DATE:03/27/2006                            DOB:          1970/08/19    Mr. Martin Pearson is a 39 year old married white male, father of 3, who  comes today self-referred for a 2nd opinion concerning an abnormal EKG  and chest discomfort.   He had an abnormal EKG in December 2005.  At that time, he had a normal  stress echo.  He underwent cardiac CTA on Jun 06, 2005, which was  normal.  He had normal coronary anatomy without calcification, and  normal left ventricular function.  He did have 1 intramyocardial segment  of the mid LAD which was not felt to be clinically relevant.  This was  performed by [facility] with Dr. Peter Swaziland.   He has continued to have chest aching with left arm tingling, which is  usually at rest.  He had it last [place]  Incidentally, he does not have any symptoms when he is exercising.  He  is an active runner and skier.   He had a cardiac screening done on December 06, 2005.  This showed left  ventricular hypertrophy by report.  I am not sure of the quality of the  scans, nor the technical support.   He has cardiac risk only of male sex, LDL of 153, total cholesterol 230,  but an HDL of 64.  There is no premature coronary history in his family.  There is no history of cardiac death or sudden cardiac death in his  family.   He does not smoke.  Does not have any hypertension.  He is not diabetic.   PAST MEDICAL HISTORY:  HE HAS NO DYE ALLERGY.   He only on Allegra daily and multivitamins.   He does drink about 10 to 12 alcoholic beverages a week.  He quit  smoking in 1994.   He has had no surgeries.   SOCIAL HISTORY:  He is in real estate.  He is married and has 3  children.   REVIEW OF SYSTEMS:  Other than the HPI, is totally negative.   All  systems reviewed.   EXAMINATION:  Blood pressure is 140/66.  His pulse is 67 and regular.  He is 6 feet tall and weighs 174 pounds.  HEENT:  Normocephalic and atraumatic.  PERRLA.  Extraocular movements  intact.  Sclerae are clear.  Facial symmetry is normal.  Dentition is  satisfactory.  NECK:  Supple.  Carotid upstrokes were equal bilaterally without bruits.  There was no JVD.  Thyroid is not enlarged.  Trachea is midline.  LUNGS:  Clear.  HEART:  Reveals a systolic murmur along the left sternal border with a  split S2.  His PMI is nondisplaced.  Remarkably, when I sat him up, his  murmur got softer, and then when I stood him up, it got a little louder.  ABDOMINAL EXAM:  Soft with good bowel sounds.  No midline  bruit.  There  is no hepatomegaly.  EXTREMITIES:  With no cyanosis, clubbing, or edema.  Pulses are intact.  NEUROLOGIC:  Exam is intact.  SKIN:  Unremarkable.  MUSCULOSKELETAL:  Unremarkable.   EKG shows normal sinus rhythm with a sinus arrhythmia.  He has ST  segment changes inferolaterally with ST segment depression and biphasic  T waves.  This is unchanged since December 2005.  His PR, QRS, and QTC  intervals are normal.   ASSESSMENT AND PLAN:  I have had a long talk with Martin Pearson today.  I  suspect that he has no coronary ischemia, but I am a little bit  concerned about this heart murmur, and the question of left ventricular  hypertrophy on a recent echo screen.  It is possible he could have some  form of hypertrophic obstructive cardiomyopathy.  You would think you  would have seen that on the cardiac CT.   PLAN:  A 2D echocardiogram to rule this out.  If this is negative,  reassurance is in order.     Thomas C. Daleen Squibb, MD, [facility]  Electronically Signed    TCW/MedQ  DD: 03/27/2006  DT: 03/27/2006  Job #: [id number]   cc:   Loraine Leriche A. Perini, M.D.

## 2011-01-08 HISTORY — PX: VASECTOMY: SHX75

## 2011-10-04 ENCOUNTER — Encounter: Payer: Self-pay | Admitting: Cardiology

## 2014-03-10 ENCOUNTER — Encounter: Payer: Self-pay | Admitting: Cardiology

## 2014-03-18 ENCOUNTER — Telehealth (HOSPITAL_COMMUNITY): Payer: Self-pay | Admitting: Cardiology

## 2014-03-18 NOTE — Telephone Encounter (Signed)
Received records from [facility] for appointment with Dr Martinique on 03/21/14.  Records given to [facility] (medical records) for Dr Doug Sou schedule on 03/21/14.  lp

## 2014-03-21 ENCOUNTER — Encounter: Payer: Self-pay | Admitting: Cardiology

## 2014-03-21 ENCOUNTER — Ambulatory Visit (INDEPENDENT_AMBULATORY_CARE_PROVIDER_SITE_OTHER): Payer: 59 | Admitting: Cardiology

## 2014-03-21 VITALS — BP 132/70 | HR 56 | Ht 72.0 in | Wt 180.0 lb

## 2014-03-21 DIAGNOSIS — I422 Other hypertrophic cardiomyopathy: Secondary | ICD-10-CM

## 2014-03-21 NOTE — Patient Instructions (Signed)
We will schedule you for an Echocardiogram  We will request your records from [facility] clinic

## 2014-03-21 NOTE — Progress Notes (Signed)
Martin Pearson Date of Birth: [birth year] Medical Record #[id number]  History of Present Illness: Martin Pearson is seen at the request of Dr. Joylene Draft to re-establish cardiac care. He is a pleasant 44 yo WM that I had seen in 2007 for evaluation of chest pain and abnormal Ecg. Ecg shows chronic T wave inversion in the inferolateral leads. When seen at that time he had been turned down for life insurance due to his abnormal Ecg. He underwent extensive evaluation here and at [facility]. He had a normal cardiac CTA and normal Echo. He has had normal stress Echo in 2008 when seen by Dr. Verl Blalock and repeat Echos in 2009 and 2011. He was eventually able to get life insurance. At the time he was under a great deal of stress and was eventually diagnosed with delayed pressure urticaria.  Since then his daughter was diagnosed with HOCM. She was evaluated at [facility] and at [facility] in [place]. As part of her work up [employer] also underwent extensive testing at [facility] and it appears that he had blood work drawn for genetic testing at [facility]. He doesn't know the results of this testing but he was told that he may have a mild variant of hypertrophic cardiomyopathy. Recently he was denied insurance again because of an elevated BNP level of 311. He denies any chest pain, SOB, palpitations, edema. He is still active physically and walks several days/week and runs 3 miles maybe a couple days a week. He previously ran 2 marathons but doesn't plan to do this anymore. He reports that his son has a milder form of HCM. His father and maternal grandmother have a history of chronically abnormal ECG but there is no family history of sudden death.     Medication List       This list is accurate as of: 03/21/14  5:56 PM.  Always use your most recent med list.               MULTIVITAMIN PO  Take by mouth.        Not on File  Past Medical History  Diagnosis Date  . Hypertrophic cardiomyopathy   . Gastritis   . Anxiety   . Urticaria      Past Surgical History  Procedure Laterality Date  . Appendectomy    . Vasectomy      History   Social History  . Marital Status: Married    Spouse Name: N/A  . Number of Children: 3  . Years of Education: N/A   Social History Main Topics  . Smoking status: Never Smoker   . Smokeless tobacco: Not on file  . Alcohol Use: 0.0 oz/week    0 Standard drinks or equivalent per week     Comment: 10-12/wk  . Drug Use: Not on file  . Sexual Activity: Not on file   Other Topics Concern  . None   Social History Narrative    Family History  Problem Relation Age of Onset  . Prostate cancer Father   . Hypertrophic cardiomyopathy Daughter   . ADD / ADHD Daughter     Review of Systems: As noted in HPI.  All other systems were reviewed and are negative.  Physical Exam: BP 132/70 mmHg  Pulse 56  Ht 6' (1.829 m)  Wt 180 lb (81.647 kg)  BMI 24.41 kg/m2 Filed Weights   03/21/14 1526  Weight: 180 lb (81.647 kg)  GENERAL:  Well appearing WM in NAD HEENT:  PERRL, EOMI, sclera are  clear. Oropharynx is clear. NECK:  No jugular venous distention, carotid upstroke brisk and symmetric, no bruits, no thyromegaly or adenopathy LUNGS:  Clear to auscultation bilaterally CHEST:  Unremarkable HEART:  RRR,  PMI not displaced or sustained,S1 and S2 within normal limits, no S3, no S4: no clicks, no rubs. There is a soft 1/6 systolic murmur at the apex. ABD:  Soft, nontender. BS +, no masses or bruits. No hepatomegaly, no splenomegaly EXT:  2 + pulses throughout, no edema, no cyanosis no clubbing SKIN:  Warm and dry.  No rashes NEURO:  Alert and oriented x 3. Cranial nerves II through XII intact. PSYCH:  Cognitively intact     LABORATORY DATA: Ecg shows NSR with inferolateral T wave inversion- unchanged from old Ecg.     Assessment / Plan: 1. Abnormal Ecg. Based on his family history I think this probably represents a variant of HCM. He is asymptomatic and in the past did not have  morphologic findings for this. We spent a long time discussing the diagnosis of hypertrophic cardiomyopathy- its pathophysiology, genetics, manifestations, and treatment. It is possible that he may have the same gene as his daughter but that the gene may have variable penetrance. I have requested his former work up at [facility] and his genetic testing at [facility]. I have recommended an Echo for follow up. If abnormal would consider cardiac MRI-- he thinks this may have been done at [facility] before. If normal he may still require serial follow up every few years as his presentation could be delayed. I think his cardiac risk at this point is still very low and moderate levels of exercise are reasonable.

## 2014-03-23 ENCOUNTER — Encounter: Payer: Self-pay | Admitting: Cardiology

## 2014-03-24 ENCOUNTER — Ambulatory Visit (HOSPITAL_COMMUNITY)
Admission: RE | Admit: 2014-03-24 | Discharge: 2014-03-24 | Disposition: A | Discharge status: Home / Self Care | Payer: 59 | Source: Ambulatory Visit | Attending: Cardiology | Admitting: Cardiology

## 2014-03-24 DIAGNOSIS — I422 Other hypertrophic cardiomyopathy: Secondary | ICD-10-CM | POA: Diagnosis not present

## 2014-03-24 NOTE — Progress Notes (Signed)
2D Echocardiogram Complete.  03/24/2014   Martin Pearson [place], [place]

## 2014-03-29 DIAGNOSIS — Z0289 Encounter for other administrative examinations: Secondary | ICD-10-CM

## 2014-04-06 ENCOUNTER — Telehealth: Payer: Self-pay | Admitting: Cardiology

## 2014-04-06 NOTE — Telephone Encounter (Signed)
Received records from prior cardiac work up at [facility] clinic in 2011 including blood work, cardiac Echo, MRI, cardiopulmonary stress test and Holter.  Results scanned into Epic  Peter Martinique MD, [facility]

## 2016-01-08 HISTORY — PX: COLONOSCOPY: SHX174

## 2016-01-08 HISTORY — PX: POLYPECTOMY: SHX149

## 2016-01-08 HISTORY — PX: UPPER GASTROINTESTINAL ENDOSCOPY: SHX188

## 2016-01-12 DIAGNOSIS — Z0000 Encounter for general adult medical examination without abnormal findings: Secondary | ICD-10-CM | POA: Diagnosis not present

## 2016-01-18 DIAGNOSIS — R9431 Abnormal electrocardiogram [ECG] [EKG]: Secondary | ICD-10-CM | POA: Diagnosis not present

## 2016-01-18 DIAGNOSIS — R7301 Impaired fasting glucose: Secondary | ICD-10-CM | POA: Diagnosis not present

## 2016-01-18 DIAGNOSIS — Z23 Encounter for immunization: Secondary | ICD-10-CM | POA: Diagnosis not present

## 2016-01-18 DIAGNOSIS — Z1389 Encounter for screening for other disorder: Secondary | ICD-10-CM | POA: Diagnosis not present

## 2016-01-18 DIAGNOSIS — D7589 Other specified diseases of blood and blood-forming organs: Secondary | ICD-10-CM | POA: Diagnosis not present

## 2016-01-18 DIAGNOSIS — Z0000 Encounter for general adult medical examination without abnormal findings: Secondary | ICD-10-CM | POA: Diagnosis not present

## 2016-01-18 DIAGNOSIS — K219 Gastro-esophageal reflux disease without esophagitis: Secondary | ICD-10-CM | POA: Diagnosis not present

## 2016-04-08 ENCOUNTER — Encounter: Payer: Self-pay | Admitting: Gastroenterology

## 2016-05-14 ENCOUNTER — Ambulatory Visit: Payer: 59 | Admitting: Gastroenterology

## 2016-06-19 ENCOUNTER — Encounter: Payer: Self-pay | Admitting: Gastroenterology

## 2016-06-19 ENCOUNTER — Ambulatory Visit (INDEPENDENT_AMBULATORY_CARE_PROVIDER_SITE_OTHER): Payer: 59 | Admitting: Gastroenterology

## 2016-06-19 VITALS — BP 98/58 | HR 72 | Ht 72.25 in | Wt 175.4 lb

## 2016-06-19 DIAGNOSIS — K219 Gastro-esophageal reflux disease without esophagitis: Secondary | ICD-10-CM

## 2016-06-19 DIAGNOSIS — Z8371 Family history of colonic polyps: Secondary | ICD-10-CM

## 2016-06-19 DIAGNOSIS — I422 Other hypertrophic cardiomyopathy: Secondary | ICD-10-CM

## 2016-06-19 MED ORDER — NA SULFATE-K SULFATE-MG SULF 17.5-3.13-1.6 GM/177ML PO SOLN: 1.0000 | Freq: Once | ORAL | 0 refills | Status: AC

## 2016-06-19 NOTE — Progress Notes (Signed)
[facility] Consult Note:  History: CAID Pearson 06/19/2016  Referring physician: Crist Infante, MD  Reason for consult/chief complaint: Hoarse; Gastroesophageal Reflux; and fam hx of colon polyps (sister adenomatous )   Subjective  HPI:  This is a 46 year old man referred by primary care noted above family history of colon polyps and a personal history of reflux. His 26 year old sister has had 2 colonoscopies with my partner Dr Hilarie Fredrickson. While I cannot access those records, Sharone tells me his sister had 13 polyps on her second colonoscopy and Dr. Hilarie Fredrickson informed her one of them would have become malignant if not removed. Therefore she probably had a dysplastic polyp. She was told to alert her siblings that they should have colonoscopy earlier than the usual screening age of 68. Jules denies abdominal pain, altered bowel habits, rectal bleeding, early satiety or weight loss. He reports many years of intermittent reflux symptoms, more so when he was eating a poor diet as a younger man. He denies dysphagia or odynophagia.  Osiah will take an as needed antacid, and sometimes take a dose if he knows he will be eating late in the evening. Symptoms are typically heartburn, hoarseness and regurgitation.  He has a diagnosis of hypertrophic cardiomyopathy, I reviewed his last cardiology note with Dr. Martinique from 2 years ago. His LV ejection fraction is normal, his LDH is noted to be "mild" with no obstruction physiology. He is quite physically active and runs frequently with no chest pain.  ROS:  Review of Systems  Constitutional: Negative for appetite change and unexpected weight change.  HENT: Negative for mouth sores and voice change.   Eyes: Negative for pain and redness.  Respiratory: Negative for cough and shortness of breath.   Cardiovascular: Negative for chest pain and palpitations.  Genitourinary: Negative for dysuria and hematuria.  Musculoskeletal:  Negative for arthralgias and myalgias.  Skin: Negative for pallor and rash.  Neurological: Negative for weakness and headaches.  Hematological: Negative for adenopathy.     Past Medical History: Past Medical History:  Diagnosis Date  . Anxiety   . Gastritis   . Hypertrophic cardiomyopathy ([place])   . Urticaria      Past Surgical History: Past Surgical History:  Procedure Laterality Date  . APPENDECTOMY    . VASECTOMY       Family History: Family History  Problem Relation Age of Onset  . Hypertrophic cardiomyopathy Daughter   . ADD / ADHD Daughter   . Prostate cancer Father   . Colon polyps Father 34  . Colon polyps Sister        adenomatous    Social History: Social History   Social History  . Marital status: Married    Spouse name: N/A  . Number of children: 3  . Years of education: N/A   Occupational History  . real estate    Social History Main Topics  . Smoking status: Never Smoker  . Smokeless tobacco: Never Used  . Alcohol use 0.0 oz/week     Comment: 10-12/wk  . Drug use: No  . Sexual activity: Yes    Partners: Female   Other Topics Concern  . None   Social History Narrative  . None    Allergies: No Known Allergies  Outpatient Meds: Current Outpatient Prescriptions  Medication Sig Dispense Refill  . Calcium Carbonate Antacid (ANTACID PO) Take by mouth as needed.    . Multiple Vitamins-Minerals (MULTIVITAMIN PO) Take by mouth.    . Na Sulfate-K Sulfate-Mg  Sulf 17.5-3.13-1.6 GM/180ML SOLN Take 1 kit by mouth once. 354 mL 0   No current facility-administered medications for this visit.       ___________________________________________________________________ Objective   Exam:  BP (!) 98/58 (BP Location: Left Arm, Patient Position: Sitting, Cuff Size: Normal)   Pulse 72   Ht 6' 0.25" (1.835 m) Comment: height measured without shoes  Wt 175 lb 6 oz (79.5 kg)   BMI 23.62 kg/m    General: this is a(n) Well-appearing middle-aged  man   Eyes: sclera anicteric, no redness  ENT: oral mucosa moist without lesions, no cervical or supraclavicular lymphadenopathy, good dentition. Normal vocal quality   CV: RRR without murmur, S1/S2, no JVD, no peripheral edema  Resp: clear to auscultation bilaterally, normal RR and effort noted  GI: soft, no tenderness, with active bowel sounds. No guarding or palpable organomegaly noted.  Skin; warm and dry, no rash or jaundice noted  Neuro: awake, alert and oriented x 3. Normal gross motor function and fluent speech  Data 2-D echocardiogram from March 2016 as noted above  Assessment: Encounter Diagnoses  Name Primary?  . Family history of colonic polyps Yes  . Gastroesophageal reflux disease, esophagitis presence not specified   . Hypertrophic cardiomyopathy ([place])     Current guidelines indicate this patient should have a screening colonoscopy because of his sister's history. If no polyps are found, he will then be screened as average risk with a repeat colonoscopy in 10 years. He also has many years of reflux, and screening for Barrett's esophagus is warranted.  Plan:  EGD and colonoscopy  The benefits and risks of the planned procedure were described in detail with the patient or (when appropriate) their health care proxy.  Risks were outlined as including, but not limited to, bleeding, infection, perforation, adverse medication reaction leading to cardiac or pulmonary decompensation, or pancreatitis (if ERCP).  The limitation of incomplete mucosal visualization was also discussed.  No guarantees or warranties were given.   Thank you for the courtesy of this consult.  Please call me with any questions or concerns.  Nelida Meuse III  CC: Crist Infante, MD

## 2016-06-19 NOTE — Patient Instructions (Signed)
You have been scheduled for an endoscopy and colonoscopy. Please follow the written instructions given to you at your visit today. Please pick up your prep supplies at the pharmacy within the next 1-3 days. If you use inhalers (even only as needed), please bring them with you on the day of your procedure. Your physician has requested that you go to www.startemmi.com and enter the access code given to you at your visit today. This web site gives a general overview about your procedure. However, you should still follow specific instructions given to you by our office regarding your preparation for the procedure.  If you are age 24 or older, your body mass index should be between 23-30. Your Body mass index is 23.62 kg/m. If this is out of the aforementioned range listed, please consider follow up with your Primary Care Provider.  If you are age 26 or younger, your body mass index should be between 19-25. Your Body mass index is 23.62 kg/m. If this is out of the aformentioned range listed, please consider follow up with your Primary Care Provider.   Thank you for choosing [facility]  Dr Wilfrid Lund III

## 2016-07-26 ENCOUNTER — Encounter: Payer: Self-pay | Admitting: Gastroenterology

## 2016-08-07 ENCOUNTER — Ambulatory Visit (AMBULATORY_SURGERY_CENTER): Payer: 59 | Admitting: Gastroenterology

## 2016-08-07 ENCOUNTER — Encounter: Payer: Self-pay | Admitting: Gastroenterology

## 2016-08-07 VITALS — BP 110/60 | HR 47 | Temp 98.6°F | Resp 11 | Ht 72.0 in | Wt 175.0 lb

## 2016-08-07 DIAGNOSIS — D120 Benign neoplasm of cecum: Secondary | ICD-10-CM | POA: Diagnosis not present

## 2016-08-07 DIAGNOSIS — D123 Benign neoplasm of transverse colon: Secondary | ICD-10-CM

## 2016-08-07 DIAGNOSIS — K219 Gastro-esophageal reflux disease without esophagitis: Secondary | ICD-10-CM | POA: Diagnosis not present

## 2016-08-07 DIAGNOSIS — Z8371 Family history of colonic polyps: Secondary | ICD-10-CM

## 2016-08-07 DIAGNOSIS — Z1211 Encounter for screening for malignant neoplasm of colon: Secondary | ICD-10-CM

## 2016-08-07 DIAGNOSIS — Z86010 Personal history of colonic polyps: Secondary | ICD-10-CM | POA: Diagnosis not present

## 2016-08-07 DIAGNOSIS — Z1212 Encounter for screening for malignant neoplasm of rectum: Secondary | ICD-10-CM

## 2016-08-07 DIAGNOSIS — K635 Polyp of colon: Secondary | ICD-10-CM | POA: Diagnosis not present

## 2016-08-07 MED ORDER — SODIUM CHLORIDE 0.9 % IV SOLN: 500.0000 mL | INTRAVENOUS | Status: AC

## 2016-08-07 NOTE — Op Note (Signed)
[facility] Patient Name: [facility] Procedure Date: 08/07/2016 1:53 PM MRN: [id number] Endoscopist: Mallie Mussel L. Loletha Carrow , MD Age: 46 Referring MD:  Date of Birth: [birth year] Gender: Male Account #: [id number] Procedure:                Upper GI endoscopy Indications:              Heartburn Medicines:                Monitored Anesthesia Care Procedure:                Pre-Anesthesia Assessment:                           - Prior to the procedure, a History and Physical                            was performed, and patient medications and                            allergies were reviewed. The patient's tolerance of                            previous anesthesia was also reviewed. The risks                            and benefits of the procedure and the sedation                            options and risks were discussed with the patient.                            All questions were answered, and informed consent                            was obtained. Prior Anticoagulants: The patient has                            taken no previous anticoagulant or antiplatelet                            agents. ASA Grade Assessment: I - A normal, healthy                            patient. After reviewing the risks and benefits,                            the patient was deemed in satisfactory condition to                            undergo the procedure.                           After obtaining informed consent, the endoscope was  passed under direct vision. Throughout the                            procedure, the patient's blood pressure, pulse, and                            oxygen saturations were monitored continuously. The                            Endoscope was introduced through the mouth, and                            advanced to the second part of duodenum. The upper                            GI endoscopy was accomplished without difficulty.                   The patient tolerated the procedure well. Scope In: Scope Out: Findings:                 The larynx was normal.                           The esophagus was normal.                           The stomach was normal.                           The cardia and gastric fundus were normal on                            retroflexion.                           The examined duodenum was normal. Complications:            No immediate complications. Estimated Blood Loss:     Estimated blood loss: none. Impression:               - Normal larynx.                           - Normal esophagus.                           - Normal stomach.                           - Normal examined duodenum.                           - No specimens collected. Recommendation:           - Patient has a contact number available for                            emergencies. The signs and symptoms of potential  delayed complications were discussed with the                            patient. Return to normal activities tomorrow.                            Written discharge instructions were provided to the                            patient.                           - Resume previous diet.                           - Resume previous diet.                           - Continue present medications.                           - See the other procedure note for documentation of                            additional recommendations. Nasra Counce L. Loletha Carrow, MD 08/07/2016 2:33:38 PM This report has been signed electronically.

## 2016-08-07 NOTE — Progress Notes (Signed)
Dental advisory given to [place] and oriented x3, pleased with MAC, report to RN

## 2016-08-07 NOTE — Op Note (Signed)
[facility] Patient Name: Martin Pearson Procedure Date: 08/07/2016 1:53 PM MRN: [id number] Endoscopist: Mallie Mussel L. Loletha Carrow , MD Age: 46 Referring MD:  Date of Birth: [birth year] Gender: Male Account #: [id number] Procedure:                Colonoscopy Indications:              Colon cancer screening in patient at increased                            risk: Family history of 1st-degree relative with                            colon polyps before age 26 years (sister with                            advanced polyp at age 30), This is the patient's                            first colonoscopy Medicines:                Monitored Anesthesia Care Procedure:                Pre-Anesthesia Assessment:                           - Prior to the procedure, a History and Physical                            was performed, and patient medications and                            allergies were reviewed. The patient's tolerance of                            previous anesthesia was also reviewed. The risks                            and benefits of the procedure and the sedation                            options and risks were discussed with the patient.                            All questions were answered, and informed consent                            was obtained. Prior Anticoagulants: The patient has                            taken no previous anticoagulant or antiplatelet                            agents. ASA Grade Assessment: I - A normal, healthy  patient. After reviewing the risks and benefits,                            the patient was deemed in satisfactory condition to                            undergo the procedure.                           After obtaining informed consent, the colonoscope                            was passed under direct vision. Throughout the                            procedure, the patient's blood pressure, pulse, and            oxygen saturations were monitored continuously. The                            Colonoscope was introduced through the anus and                            advanced to the the cecum, identified by                            appendiceal orifice and ileocecal valve. The                            colonoscopy was performed without difficulty. The                            patient tolerated the procedure well. The quality                            of the bowel preparation was excellent. The                            ileocecal valve, appendiceal orifice, and rectum                            were photographed. The quality of the bowel                            preparation was evaluated using the BBPS [facility]                            Bowel Preparation Scale) with scores of: Right                            Colon = 3, Transverse Colon = 3 and Left Colon = 3                            (entire mucosa seen well with no residual staining,  small fragments of stool or opaque liquid). The                            total BBPS score equals 9. The bowel preparation                            used was SUPREP. Scope In: 2:11:26 PM Scope Out: 2:24:42 PM Scope Withdrawal Time: 0 hours 9 minutes 49 seconds  Total Procedure Duration: 0 hours 13 minutes 16 seconds  Findings:                 The perianal and digital rectal examinations were                            normal.                           Two sessile polyps were found in the hepatic                            flexure and cecum. The polyps were 2 mm in size.                            These polyps were removed with a cold snare.                            Resection and retrieval were complete.                           The exam was otherwise without abnormality on                            direct and retroflexion views. Complications:            No immediate complications. Estimated Blood Loss:     Estimated  blood loss: none. Impression:               - Two 2 mm polyps at the hepatic flexure and in the                            cecum, removed with a cold snare. Resected and                            retrieved.                           - The examination was otherwise normal on direct                            and retroflexion views. Recommendation:           - Patient has a contact number available for                            emergencies. The signs and symptoms of potential  delayed complications were discussed with the                            patient. Return to normal activities tomorrow.                            Written discharge instructions were provided to the                            patient.                           - Resume previous diet.                           - Continue present medications.                           - Await pathology results.                           - Repeat colonoscopy is recommended for                            surveillance. The colonoscopy date will be                            determined after pathology results from today's                            exam become available for review. Virtie Bungert L. Loletha Carrow, MD 08/07/2016 2:36:10 PM This report has been signed electronically.

## 2016-08-07 NOTE — Progress Notes (Signed)
Pt's states no medical or surgical changes since previsit or office visit. 

## 2016-08-07 NOTE — Patient Instructions (Addendum)
YOU HAD AN ENDOSCOPIC PROCEDURE TODAY AT THE [facility]:   Refer to the procedure report that was given to you for any specific questions about what was found during the examination.  If the procedure report does not answer your questions, please call your gastroenterologist to clarify.  If you requested that your care partner not be given the details of your procedure findings, then the procedure report has been included in a sealed envelope for you to review at your convenience later.  YOU SHOULD EXPECT: Some feelings of bloating in the abdomen. Passage of more gas than usual.  Walking can help get rid of the air that was put into your GI tract during the procedure and reduce the bloating. If you had a lower endoscopy (such as a colonoscopy or flexible sigmoidoscopy) you may notice spotting of blood in your stool or on the toilet paper. If you underwent a bowel prep for your procedure, you may not have a normal bowel movement for a few days.  Please Note:  You might notice some irritation and congestion in your nose or some drainage.  This is from the oxygen used during your procedure.  There is no need for concern and it should clear up in a day or so.  SYMPTOMS TO REPORT IMMEDIATELY:   Following lower endoscopy (colonoscopy or flexible sigmoidoscopy):  Excessive amounts of blood in the stool  Significant tenderness or worsening of abdominal pains  Swelling of the abdomen that is new, acute  Fever of 100F or higher   Following upper endoscopy (EGD)  Vomiting of blood or coffee ground material  New chest pain or pain under the shoulder blades  Painful or persistently difficult swallowing  New shortness of breath  Fever of 100F or higher  Black, tarry-looking stools  For urgent or emergent issues, a gastroenterologist can be reached at any hour by calling [phone].   DIET:  We do recommend a small meal at first, but then you may proceed to your regular diet.  Drink  plenty of fluids but you should avoid alcoholic beverages for 24 hours.  ACTIVITY:  You should plan to take it easy for the rest of today and you should NOT DRIVE or use heavy machinery until tomorrow (because of the sedation medicines used during the test).    FOLLOW UP: Our staff will call the number listed on your records the next business day following your procedure to check on you and address any questions or concerns that you may have regarding the information given to you following your procedure. If we do not reach you, we will leave a message.  However, if you are feeling well and you are not experiencing any problems, there is no need to return our call.  We will assume that you have returned to your regular daily activities without incident.  If any biopsies were taken you will be contacted by phone or by letter within the next 1-3 weeks.  Please call us at [phone] if you have not heard about the biopsies in 3 weeks.    SIGNATURES/CONFIDENTIALITY: You and/or your care partner have signed paperwork which will be entered into your electronic medical record.  These signatures attest to the fact that that the information above on your After Visit Summary has been reviewed and is understood.  Full responsibility of the confidentiality of this discharge information lies with you and/or your care-partner.   Resume medications. 

## 2016-08-07 NOTE — Progress Notes (Signed)
Called to room to assist during endoscopic procedure.  Patient ID and intended procedure confirmed with present staff. Received instructions for my participation in the procedure from the performing physician.  

## 2016-08-08 ENCOUNTER — Telehealth: Payer: Self-pay | Admitting: *Deleted

## 2016-08-08 NOTE — Telephone Encounter (Signed)
  Follow up Call-  Call back number 08/07/2016  Post procedure Call Back phone  # [phone]  Permission to leave phone message Yes  Some recent data might be hidden     Patient questions:  Do you have a fever, pain , or abdominal swelling? No. Pain Score  0 *  Have you tolerated food without any problems? Yes.    Have you been able to return to your normal activities? Yes.    Do you have any questions about your discharge instructions: Diet   No. Medications  No. Follow up visit  No.  Do you have questions or concerns about your Care? No.  Actions: * If pain score is 4 or above: No action needed, pain <4.

## 2016-08-13 ENCOUNTER — Encounter: Payer: Self-pay | Admitting: Gastroenterology

## 2017-06-12 DIAGNOSIS — Z0000 Encounter for general adult medical examination without abnormal findings: Secondary | ICD-10-CM | POA: Diagnosis not present

## 2017-06-12 DIAGNOSIS — R82998 Other abnormal findings in urine: Secondary | ICD-10-CM | POA: Diagnosis not present

## 2017-06-16 DIAGNOSIS — K219 Gastro-esophageal reflux disease without esophagitis: Secondary | ICD-10-CM | POA: Diagnosis not present

## 2017-06-16 DIAGNOSIS — D7589 Other specified diseases of blood and blood-forming organs: Secondary | ICD-10-CM | POA: Diagnosis not present

## 2017-06-16 DIAGNOSIS — Z0000 Encounter for general adult medical examination without abnormal findings: Secondary | ICD-10-CM | POA: Diagnosis not present

## 2017-06-16 DIAGNOSIS — Z1389 Encounter for screening for other disorder: Secondary | ICD-10-CM | POA: Diagnosis not present

## 2017-06-16 DIAGNOSIS — R9431 Abnormal electrocardiogram [ECG] [EKG]: Secondary | ICD-10-CM | POA: Diagnosis not present

## 2017-06-19 DIAGNOSIS — Z1212 Encounter for screening for malignant neoplasm of rectum: Secondary | ICD-10-CM | POA: Diagnosis not present

## 2017-07-21 ENCOUNTER — Ambulatory Visit: Payer: 59 | Admitting: Cardiology

## 2017-09-25 ENCOUNTER — Encounter: Payer: Self-pay | Admitting: Cardiology

## 2017-10-06 NOTE — Progress Notes (Signed)
Martin Pearson Date of Birth: 09-Aug-1970 Medical Record #[id number]  History of Present Illness: Martin Pearson is seen for follow up HCM. He was last seen in March 2016. He was initially seen in 2007 for evaluation of chest pain and abnormal Ecg. Ecg shows chronic T wave inversion in the inferolateral leads. When seen at that time he had been turned down for life insurance due to his abnormal Ecg. He underwent extensive evaluation here and at [facility]. He had a normal cardiac CTA and normal Echo. He has had normal stress Echo in 2008 when seen by [facility] and repeat Echos in 2009 and 2011. He was eventually able to get life insurance. At the time he was under a great deal of stress and was eventually diagnosed with delayed pressure urticaria.   Since then his daughter was diagnosed with HOCM. She was evaluated at [facility] and at [facility] in [place]. As part of her work up Aon Pearson also underwent extensive testing at [facility]. He also had genetic testing at [facility] with findings summarized from their report: "We reviewed the results of genetic testing done on Martin [facility]. We ordered genetic testing to look specifically for the presence or absence of the genetic change identified in the MYBPC3 gene in his daughter; no other genes or genetic mutations were included in this testing. The results indicate Martin Pearson also has the same genetic change found in his daughter. Therefore, she must have inherited this change from him. 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 Echo at that time showed upper limits of normal septal thickness. Mild chordal SAM without outflow  obstruction. No MR. MRI showed septal hypertrophy maximum in Martin ventricle of 19 mm. No LVOT obstruction. Delayed enhancement in the Martin and apical septum.   On follow up today he denies any chest pain, SOB, palpitations, edema. No syncope. Martin level is excellent. He is still active physically running, going to the gym and Yoga. He ran in the [place] in November.     He reports that his son age 24 has a milder form of HCM. Also being followed at [facility]. His daughter had an ICD placed last year at age 41. Septal thickness 26 mm on MRI. His father and maternal grandmother have a history of chronically abnormal ECG but have not been tested for the gene. His brother does have the gene also. There is no family history of sudden death.   Allergies as of 2017-10-25   No Known Allergies     Medication List        Accurate as of 2017/10/25  9:16 AM. Always use your most recent med list.          ANTACID PO Take by mouth as needed.   MULTIVITAMIN PO Take 1 tablet by mouth daily as needed.       No Known Allergies  Past Medical History:  Diagnosis Date  . Anxiety   . Gastritis   . Hypertrophic cardiomyopathy ([place])   . Urticaria     Past Surgical History:  Procedure Laterality Date  . APPENDECTOMY    . VASECTOMY      Social  History   Socioeconomic History  . Marital status: Married    Spouse name: Not on file  . Number of children: 3  . Years of education: Not on file  . Highest education level: Not on file  Occupational History  . Occupation: real estate  Social Needs  . Financial resource strain: Not on file  . Food insecurity:    Worry: Not on file    Inability: Not on file  . Transportation needs:    Medical: Not on file    Non-medical: Not on file  Tobacco Use  . Smoking status: Never Smoker  . Smokeless tobacco: Never Used  Substance and Sexual Activity  . Alcohol use: Yes    Alcohol/week: 0.0 standard drinks    Comment: 10-12/wk  . Drug use: No  .  Sexual activity: Yes    Partners: Female  Lifestyle  . Physical activity:    Days per week: Not on file    Minutes per session: Not on file  . Stress: Not on file  Relationships  . Social connections:    Talks on phone: Not on file    Gets together: Not on file    Attends religious service: Not on file    Active member of club or organization: Not on file    Attends meetings of clubs or organizations: Not on file    Relationship status: Not on file  Other Topics Concern  . Not on file  Social History Narrative  . Not on file    Family History  Problem Relation Age of Onset  . Hypertrophic cardiomyopathy Daughter   . ADD / ADHD Daughter   . Prostate cancer Father   . Colon polyps Father 69  . Colon polyps Sister        adenomatous    Review of Systems: As noted in HPI.  All other systems were reviewed and are negative.  Physical Exam: BP 98/70 (BP Location: Right Arm)   Pulse (!) 47   Ht 6' (1.829 m)   Wt 177 lb (80.3 kg)   BMI 24.01 kg/m  Filed Weights   10/08/17 0855  Weight: 177 lb (80.3 kg)  GENERAL:  Well appearing WM in NAD HEENT:  PERRL, EOMI, sclera are clear. Oropharynx is clear. NECK:  No jugular venous distention, carotid upstroke brisk and symmetric, no bruits, no thyromegaly or adenopathy LUNGS:  Clear to auscultation bilaterally CHEST:  Unremarkable HEART:  RRR,  PMI not displaced or sustained,S1 and S2 within normal limits, no S3, no S4: no clicks, no rubs, no murmurs ABD:  Soft, nontender. BS +, no masses or bruits. No hepatomegaly, no splenomegaly EXT:  2 + pulses throughout, no edema, no cyanosis no clubbing SKIN:  Warm and dry.  No rashes NEURO:  Alert and oriented x 3. Cranial nerves II through XII intact. PSYCH:  Cognitively intact   LABORATORY DATA:  Dated 06/12/17: cholesterol 196, triglycerides 56, HDL 61, LDL 124. A1c 5.2%. Creatinine 1.1. Hgb, TSH, ALT normal  Ecg shows NSR rate 47  with marked inferolateral T wave inversion-  unchanged from old Ecg.   Echo 03/24/14: Study Conclusions  - Left ventricle: The cavity size was normal. Wall thickness was increased in a pattern of mild LVH. There was moderate focal basal hypertrophy of the septum. No LVOT obstruction was noted. Systolic function was normal. The estimated ejection fraction was in the range of 55% to 60%. Wall motion was normal; there were no regional wall motion abnormalities. Left  ventricular diastolic function parameters were normal. - Left atrium: LA Volume/BSA= 33.3 ml/m2. The atrium was at the upper limits of normal in size. - Right atrium: The atrium was mildly dilated. - Inferior vena cava: The vessel was normal in size. The respirophasic diameter changes were in the normal range (= 50%), consistent with normal central venous pressure.  Impressions:  - LVEF 55-60%, mild LV wall thickening with moderate focal basal septal thickening. No LVOT obstruction. Normal diastolic function, upper normal LA size, mild RAE  Assessment / Plan: 1. 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 Abnormal Ecg.  He is asymptomatic.  I have recommended an Echo for follow up. Will plan follow up every 3 years unless he develops new symptoms.

## 2017-10-08 ENCOUNTER — Ambulatory Visit (INDEPENDENT_AMBULATORY_CARE_PROVIDER_SITE_OTHER): Payer: 59 | Admitting: Cardiology

## 2017-10-08 ENCOUNTER — Encounter: Payer: Self-pay | Admitting: Cardiology

## 2017-10-08 VITALS — BP 98/70 | HR 47 | Ht 72.0 in | Wt 177.0 lb

## 2017-10-08 DIAGNOSIS — I422 Other hypertrophic cardiomyopathy: Secondary | ICD-10-CM

## 2017-10-08 NOTE — Patient Instructions (Signed)
We will schedule you for an Echocardiogram   

## 2017-10-09 DIAGNOSIS — Z23 Encounter for immunization: Secondary | ICD-10-CM | POA: Diagnosis not present

## 2017-10-13 ENCOUNTER — Ambulatory Visit (HOSPITAL_COMMUNITY): Payer: 59 | Attending: Cardiovascular Disease

## 2017-10-13 ENCOUNTER — Other Ambulatory Visit: Payer: Self-pay

## 2017-10-13 DIAGNOSIS — I422 Other hypertrophic cardiomyopathy: Secondary | ICD-10-CM

## 2017-10-13 DIAGNOSIS — R079 Chest pain, unspecified: Secondary | ICD-10-CM | POA: Diagnosis not present

## 2018-02-11 DIAGNOSIS — R42 Dizziness and giddiness: Secondary | ICD-10-CM | POA: Diagnosis not present

## 2018-02-11 DIAGNOSIS — H93293 Other abnormal auditory perceptions, bilateral: Secondary | ICD-10-CM | POA: Diagnosis not present

## 2019-02-13 ENCOUNTER — Ambulatory Visit: Payer: 59 | Attending: Internal Medicine

## 2019-02-13 DIAGNOSIS — Z23 Encounter for immunization: Secondary | ICD-10-CM | POA: Insufficient documentation

## 2019-02-13 NOTE — Progress Notes (Signed)
   Covid-19 Vaccination Clinic  Name:  Martin Pearson    MRN: [id number] DOB: [birth year]  02/13/2019  [name] was observed post Covid-19 immunization for 15 minutes without incidence. He was provided with Vaccine Information Sheet and instruction to access the V-Safe system.   [name] was instructed to call 911 with any severe reactions post vaccine: Marland Kitchen Difficulty breathing  . Swelling of your face and throat  . A fast heartbeat  . A bad rash all over your body  . Dizziness and weakness    Immunizations Administered    Name Date Dose VIS Date Route   Pfizer COVID-19 Vaccine 02/13/2019  7:02 PM 0.3 mL 12/18/2018 Intramuscular   Manufacturer: [place]   Lot: [id number]   [place]: [id number]

## 2019-03-10 ENCOUNTER — Ambulatory Visit: Payer: 59 | Attending: Internal Medicine

## 2019-03-10 DIAGNOSIS — Z23 Encounter for immunization: Secondary | ICD-10-CM | POA: Insufficient documentation

## 2019-03-10 NOTE — Progress Notes (Signed)
   Covid-19 Vaccination Clinic  Name:  Martin Pearson    MRN: [id number] DOB: 1970/01/14  03/10/2019  Martin Pearson was observed post Covid-19 immunization for 15 minutes without incident. He was provided with Vaccine Information Sheet and instruction to access the V-Safe system.   Martin Pearson was instructed to call 911 with any severe reactions post vaccine: Marland Kitchen Difficulty breathing  . Swelling of face and throat  . A fast heartbeat  . A bad rash all over body  . Dizziness and weakness   Immunizations Administered    Name Date Dose VIS Date Route   Pfizer COVID-19 Vaccine 03/10/2019  4:09 PM 0.3 mL 12/18/2018 Intramuscular   Manufacturer: [place]   Lot: [id number]   [place]: [id number]

## 2020-12-13 NOTE — Progress Notes (Signed)
Martin Pearson Date of Birth: 04-04-1970 Medical Record #[id number]  History of Present Illness: Martin Pearson is seen for follow up HCM. He was last seen in October 2019. He was initially seen in 2007 for evaluation of chest pain and abnormal Ecg. Ecg shows chronic T wave inversion in the inferolateral leads. When seen at that time he had been turned down for life insurance due to his abnormal Ecg. He underwent extensive evaluation here and at [facility]. He had a normal cardiac CTA and normal Echo. He has had normal stress Echo in 2008 when seen by Dr. Verl Blalock and repeat Echos in 2009 and 2011. He was eventually able to get life insurance. At the time he was under a great deal of stress and was eventually diagnosed with delayed pressure urticaria.   Since then his daughter was diagnosed with HOCM. She was evaluated at [facility] and at [facility] in [place]. As part of her work up [employer] also underwent extensive testing at [facility]. He also had genetic testing at [facility] with findings summarized from their report: "We reviewed the results of genetic testing done on [name]. We ordered genetic testing to look specifically for the presence or absence of the genetic change identified in the MYBPC3 gene in his daughter; no other genes or genetic mutations were included in this testing. The results indicate [name] also has the same genetic change found in his daughter. Therefore, she must have inherited this change from him. Given 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 Echo at that time showed upper limits of normal septal thickness. Mild chordal SAM without  outflow obstruction. No MR. MRI showed septal hypertrophy maximum in mid ventricle of 19 mm. No LVOT obstruction. Delayed enhancement in the mid and apical septum.   On follow up today he denies any chest pain, SOB, palpitations, edema. No syncope. Energy level is excellent. He is still active physically but not running as much as he has in the past.      He reports that his youngest  son has a milder form of HCM. He states [facility] said he didn't need further follow up.  His daughter had an ICD placed at age 2. Septal thickness 26 mm on MRI. She is followed closely at [facility] clinic. His father and maternal grandmother have a history of chronically abnormal ECG but have not been tested for the gene. His brother does have the gene also. There is no family history of sudden death.   Allergies as of 2021/01/10   No Known Allergies      Medication List        Accurate as of 01-10-2021 10:23 AM. If you have any questions, ask your nurse or doctor.          ANTACID PO Take by mouth as needed.   MULTIVITAMIN PO Take 1 tablet by mouth daily as needed.        No Known Allergies  Past Medical History:  Diagnosis Date   Anxiety    Gastritis    Hypertrophic cardiomyopathy ([place])    Urticaria     Past Surgical History:  Procedure Laterality Date  APPENDECTOMY     VASECTOMY      Social History   Socioeconomic History   Marital status: Married    Spouse name: Not on file   Number of children: 3   Years of education: Not on file   Highest education level: Not on file  Occupational History   Occupation: real estate  Tobacco Use   Smoking status: Never   Smokeless tobacco: Never  Vaping Use   Vaping Use: Never used  Substance and Sexual Activity   Alcohol use: Yes    Alcohol/week: 0.0 standard drinks    Comment: 10-12/wk   Drug use: No   Sexual activity: Yes    Partners: Female  Other Topics Concern   Not on file  Social History Narrative   Not on file    Social Determinants of Health   Financial Resource Strain: Not on file  Food Insecurity: Not on file  Transportation Needs: Not on file  Physical Activity: Not on file  Stress: Not on file  Social Connections: Not on file    Family History  Problem Relation Age of Onset   Hypertrophic cardiomyopathy Daughter    ADD / ADHD Daughter    Prostate cancer Father    Colon polyps Father 53   Colon polyps Sister        adenomatous    Review of Systems: As noted in HPI.  All other systems were reviewed and are negative.  Physical Exam: BP 110/71   Pulse (!) 48   Ht 6' (1.829 m)   Wt 185 lb 12.8 oz (84.3 kg)   SpO2 99%   BMI 25.20 kg/m  Filed Weights   12/18/20 0959  Weight: 185 lb 12.8 oz (84.3 kg)   GENERAL:  Well appearing WM in NAD HEENT:  PERRL, EOMI, sclera are clear. Oropharynx is clear. NECK:  No jugular venous distention, carotid upstroke brisk and symmetric, no bruits, no thyromegaly or adenopathy LUNGS:  Clear to auscultation bilaterally CHEST:  Unremarkable HEART:  RRR,  PMI not displaced or sustained,S1 and S2 within normal limits, no S3, no S4: no clicks, no rubs, no murmurs ABD:  Soft, nontender. BS +, no masses or bruits. No hepatomegaly, no splenomegaly EXT:  2 + pulses throughout, no edema, no cyanosis no clubbing SKIN:  Warm and dry.  No rashes NEURO:  Alert and oriented x 3. Cranial nerves II through XII intact. PSYCH:  Cognitively intact   LABORATORY DATA:  Dated 06/12/17: cholesterol 196, triglycerides 56, HDL 61, LDL 124. A1c 5.2%. Creatinine 1.1. Hgb, TSH, ALT normal Dated 07/26/19: cholesterol 220, triglycerides 89, HDL 79, LDL 123. A1c 5%. Creatinine 1.1. otherwise CMET and CBC normal  Ecg shows NSR rate 48  with marked inferolateral T wave inversion. He has intermittent ventricular escape with no significant change in rate- 5 beats in total. I have personally reviewed and interpreted this study.   Echo 03/24/14: Study Conclusions  - Left  ventricle: The cavity size was normal. Wall thickness was   increased in a pattern of mild LVH. There was moderate focal   basal hypertrophy of the septum. No LVOT obstruction was noted.   Systolic function was normal. The estimated ejection fraction was   in the range of 55% to 60%. Wall motion was normal; there were no   regional wall motion abnormalities. Left ventricular diastolic   function parameters were normal. - Left atrium: LA Volume/BSA= 33.3 ml/m2. The atrium was at the   upper limits of normal  in size. - Right atrium: The atrium was mildly dilated. - Inferior vena cava: The vessel was normal in size. The   respirophasic diameter changes were in the normal range (= 50%),   consistent with normal central venous pressure.  Impressions:  - LVEF 55-60%, mild LV wall thickening with moderate focal basal   septal thickening. No LVOT obstruction. Normal diastolic   function, upper normal LA size, mild RAE  Echo 10/23/17: Study Conclusions   - Left ventricle: The cavity size was normal. Wall thickness was    increased in a pattern of mild LVH. Systolic function was normal.    The estimated ejection fraction was in the range of 60% to 65%.    Wall motion was normal; there were no regional wall motion    abnormalities. Left ventricular diastolic function parameters    were normal for the patient&'s age.   Assessment / Plan: 1. 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 Abnormal Ecg chronic. Prior Echo and MRI showed predominantly mid ventricular septal hypertrophy.   He is asymptomatic.  I have recommended an Echo for follow up. If stable will  follow up every 3 years unless he develops new symptoms. 2. Ventricular escape rhythm- same rate as sinus and asymptomatic. Will monitor for symptoms.

## 2020-12-18 ENCOUNTER — Other Ambulatory Visit: Payer: Self-pay

## 2020-12-18 ENCOUNTER — Ambulatory Visit: Payer: 59 | Admitting: Cardiology

## 2020-12-18 ENCOUNTER — Encounter: Payer: Self-pay | Admitting: Cardiology

## 2020-12-18 VITALS — BP 110/71 | HR 48 | Ht 72.0 in | Wt 185.8 lb

## 2020-12-18 DIAGNOSIS — I422 Other hypertrophic cardiomyopathy: Secondary | ICD-10-CM | POA: Diagnosis not present

## 2020-12-18 NOTE — Patient Instructions (Signed)
Medication Instructions:  Continue same medications *If you need a refill on your cardiac medications before your next appointment, please call your pharmacy*   Lab Work: None ordered   Testing/Procedures: Echo   Follow-Up: At [facility], you and your health needs are our priority.  As part of our continuing mission to provide you with exceptional heart care, we have created designated Provider Care Teams.  These Care Teams include your primary Cardiologist (physician) and Advanced Practice Providers (APPs -  Physician Assistants and Nurse Practitioners) who all work together to provide you with the care you need, when you need it.  We recommend signing up for the patient portal called "MyChart".  Sign up information is provided on this After Visit Summary.  MyChart is used to connect with patients for Virtual Visits (Telemedicine).  Patients are able to view lab/test results, encounter notes, upcoming appointments, etc.  Non-urgent messages can be sent to your provider as well.   To learn more about what you can do with MyChart, go to [email or web address].      Your next appointment:  3 years  12/2023    The format for your next appointment:  Office     Provider:  Dr.Jordan

## 2021-01-15 ENCOUNTER — Other Ambulatory Visit (HOSPITAL_COMMUNITY): Payer: 59

## 2021-01-15 ENCOUNTER — Ambulatory Visit (HOSPITAL_COMMUNITY): Payer: 59 | Attending: Cardiovascular Disease

## 2021-01-15 ENCOUNTER — Other Ambulatory Visit: Payer: Self-pay

## 2021-01-15 DIAGNOSIS — I422 Other hypertrophic cardiomyopathy: Secondary | ICD-10-CM | POA: Diagnosis present

## 2021-01-15 LAB — ECHOCARDIOGRAM COMPLETE
Area-P 1/2: 3.53 cm2
S' Lateral: 3.3 cm

## 2021-02-27 ENCOUNTER — Telehealth: Payer: Self-pay | Admitting: Gastroenterology

## 2021-02-27 NOTE — Telephone Encounter (Signed)
Hi Dr. Hilarie Fredrickson,  Will you approve?   Thanks

## 2021-02-27 NOTE — Telephone Encounter (Signed)
Hi Dr. Loletha Carrow,   Patient called and requested a transfer of care over to Dr. Hilarie Fredrickson said he is a a family friend. Is due in August for his recall colonoscopy.  Please advise on scheduling.   Thanks

## 2021-02-27 NOTE — Telephone Encounter (Signed)
Ok with me 

## 2021-02-27 NOTE — Telephone Encounter (Signed)
Yes, of course, since that is the patient's preference.  - HD

## 2021-02-28 NOTE — Telephone Encounter (Signed)
Called patient to advise and he was happy with approval.

## 2021-09-25 ENCOUNTER — Encounter: Payer: Self-pay | Admitting: Gastroenterology

## 2021-09-28 ENCOUNTER — Encounter: Payer: Self-pay | Admitting: Internal Medicine

## 2021-11-05 ENCOUNTER — Ambulatory Visit (AMBULATORY_SURGERY_CENTER): Payer: Self-pay

## 2021-11-05 VITALS — Ht 72.0 in | Wt 181.0 lb

## 2021-11-05 DIAGNOSIS — Z86010 Personal history of colonic polyps: Secondary | ICD-10-CM

## 2021-11-05 MED ORDER — NA SULFATE-K SULFATE-MG SULF 17.5-3.13-1.6 GM/177ML PO SOLN: 1.0000 | Freq: Once | ORAL | 0 refills | Status: AC

## 2021-11-05 NOTE — Progress Notes (Signed)
No egg or soy allergy known to patient  No issues known to pt with past sedation with any surgeries or procedures Patient denies ever being told they had issues or difficulty with intubation  No FH of Malignant Hyperthermia Pt is not on diet pills Pt is not on home 02  Pt is not on blood thinners  Pt denies issues with constipation;  No A fib or A flutter Have any cardiac testing pending--NO Pt instructed to use Singlecare.com or GoodRx for a price reduction on prep   Insurance verified during [place] appt=[employer]  Patient's chart reviewed by Osvaldo Angst CNRA prior to previsit and patient appropriate for the [place].  Previsit completed and red dot placed by patient's name on their procedure day (on provider's schedule).

## 2021-11-20 ENCOUNTER — Encounter: Payer: Self-pay | Admitting: Certified Registered Nurse Anesthetist

## 2021-11-21 ENCOUNTER — Encounter: Payer: Self-pay | Admitting: Internal Medicine

## 2021-11-26 ENCOUNTER — Other Ambulatory Visit: Payer: Self-pay | Admitting: Internal Medicine

## 2021-11-26 DIAGNOSIS — E785 Hyperlipidemia, unspecified: Secondary | ICD-10-CM

## 2021-11-27 ENCOUNTER — Ambulatory Visit (AMBULATORY_SURGERY_CENTER): Payer: 59 | Admitting: Internal Medicine

## 2021-11-27 ENCOUNTER — Encounter: Payer: Self-pay | Admitting: Internal Medicine

## 2021-11-27 VITALS — BP 104/66 | HR 51 | Temp 97.7°F | Resp 14 | Ht 72.0 in | Wt 181.0 lb

## 2021-11-27 DIAGNOSIS — Z09 Encounter for follow-up examination after completed treatment for conditions other than malignant neoplasm: Secondary | ICD-10-CM | POA: Diagnosis not present

## 2021-11-27 DIAGNOSIS — K635 Polyp of colon: Secondary | ICD-10-CM

## 2021-11-27 DIAGNOSIS — Z86010 Personal history of colonic polyps: Secondary | ICD-10-CM | POA: Diagnosis not present

## 2021-11-27 DIAGNOSIS — D123 Benign neoplasm of transverse colon: Secondary | ICD-10-CM

## 2021-11-27 MED ORDER — SODIUM CHLORIDE 0.9 % IV SOLN: 500.0000 mL | INTRAVENOUS | Status: DC

## 2021-11-27 NOTE — Op Note (Signed)
[place] Patient Name: Martin Pearson Procedure Date: 11/27/2021 9:46 AM MRN: [id number] Endoscopist: Jerene Bears , MD, [id number] Age: 51 Referring MD:  Date of Birth: 1970/08/08 Gender: Male Account #: [id number] Procedure:                Colonoscopy Indications:              High risk colon cancer surveillance: Personal                            history of sessile serrated colon polyps (less than                            10 mm in size) with no dysplasia, Last colonoscopy:                            August 2018 Medicines:                Monitored Anesthesia Care Procedure:                Pre-Anesthesia Assessment:                           - Prior to the procedure, a History and Physical                            was performed, and patient medications and                            allergies were reviewed. The patient's tolerance of                            previous anesthesia was also reviewed. The risks                            and benefits of the procedure and the sedation                            options and risks were discussed with the patient.                            All questions were answered, and informed consent                            was obtained. Prior Anticoagulants: The patient has                            taken no anticoagulant or antiplatelet agents. ASA                            Grade Assessment: II - A patient with mild systemic                            disease. After reviewing the risks and benefits,  the patient was deemed in satisfactory condition to                            undergo the procedure.                           After obtaining informed consent, the colonoscope                            was passed under direct vision. Throughout the                            procedure, the patient's blood pressure, pulse, and                            oxygen saturations were monitored continuously.  The                            Olympus CF-HQ190L [id number]) Colonoscope was                            introduced through the anus and advanced to the                            cecum, identified by appendiceal orifice and                            ileocecal valve. The colonoscopy was performed                            without difficulty. The patient tolerated the                            procedure well. The quality of the bowel                            preparation was excellent. The ileocecal valve,                            appendiceal orifice, and rectum were photographed. Scope In: 9:48:41 AM Scope Out: 10:07:04 AM Scope Withdrawal Time: 0 hours 14 minutes 59 seconds  Total Procedure Duration: 0 hours 18 minutes 23 seconds  Findings:                 The digital rectal exam was normal.                           A 3 mm polyp was found in the hepatic flexure. The                            polyp was sessile. The polyp was removed with a                            cold snare. Resection and retrieval were complete.  A 4 mm polyp was found in the transverse colon. The                            polyp was sessile. The polyp was removed with a                            cold snare. Resection and retrieval were complete.                           The retroflexed view of the distal rectum and anal                            verge was normal and showed no anal or rectal                            abnormalities. Complications:            No immediate complications. Estimated Blood Loss:     Estimated blood loss: none. Impression:               - One 3 mm polyp at the hepatic flexure, removed                            with a cold snare. Resected and retrieved.                           - One 4 mm polyp in the transverse colon, removed                            with a cold snare. Resected and retrieved.                           - The distal rectum and anal verge  are normal on                            retroflexion view. Recommendation:           - Patient has a contact number available for                            emergencies. The signs and symptoms of potential                            delayed complications were discussed with the                            patient. Return to normal activities tomorrow.                            Written discharge instructions were provided to the                            patient.                           -  Resume previous diet.                           - Continue present medications.                           - Await pathology results.                           - Repeat colonoscopy is recommended for                            surveillance. The colonoscopy date will be                            determined after pathology results from today's                            exam become available for review. Jerene Bears, MD 11/27/2021 10:09:59 AM This report has been signed electronically.

## 2021-11-27 NOTE — Progress Notes (Signed)
GASTROENTEROLOGY PROCEDURE H&P NOTE   Primary Care Physician: Crist Infante, MD    Reason for Procedure:   Hx of colon polyps  Plan:    colonoscopy  Patient is appropriate for endoscopic procedure(s) in the ambulatory ([place]) setting.  The nature of the procedure, as well as the risks, benefits, and alternatives were carefully and thoroughly reviewed with the patient. Ample time for discussion and questions allowed. The patient understood, was satisfied, and agreed to proceed.     HPI: Martin Pearson is a 51 y.o. male who presents for surveillance colonoscopy.  Medical history as below.  Tolerated the prep.  No recent chest pain or shortness of breath.  No abdominal pain today.  Past Medical History:  Diagnosis Date   Anxiety    Gastritis    GERD (gastroesophageal reflux disease)    with certain foods/times of the day   Hypertrophic cardiomyopathy ([place])    Urticaria     Past Surgical History:  Procedure Laterality Date   APPENDECTOMY  1986   COLONOSCOPY  2018   HD-MAC-suprep(exc)-2 SSP   POLYPECTOMY  2018   SSP x 2   UPPER GASTROINTESTINAL ENDOSCOPY  2018   [place]-MAC-normal   VASECTOMY  2013    Prior to Admission medications   Not on File    No current outpatient medications on file.   Current Facility-Administered Medications  Medication Dose Route Frequency Provider Last Rate Last Admin   0.9 %  sodium chloride infusion  500 mL Intravenous Continuous Tamarra Geiselman, Lajuan Lines, MD        Allergies as of 11/27/2021   (No Known Allergies)    Family History  Problem Relation Age of Onset   Prostate cancer Father    Colon polyps Father 74   Colon polyps Sister 49   Colon polyps Brother 68   Hypertrophic cardiomyopathy Daughter    ADD / ADHD Daughter    Esophageal cancer Neg Hx    Stomach cancer Neg Hx    Rectal cancer Neg Hx    Colon cancer Neg Hx     Social History   Socioeconomic History   Marital status: Married    Spouse name: Not on file   Number  of children: 3   Years of education: Not on file   Highest education level: Not on file  Occupational History   Occupation: real estate  Tobacco Use   Smoking status: Never   Smokeless tobacco: Never  Vaping Use   Vaping Use: Never used  Substance and Sexual Activity   Alcohol use: Not Currently    Comment: socially   Drug use: No   Sexual activity: Yes    Partners: Female  Other Topics Concern   Not on file  Social History Narrative   Not on file   Social Determinants of Health   Financial Resource Strain: Not on file  Food Insecurity: Not on file  Transportation Needs: Not on file  Physical Activity: Not on file  Stress: Not on file  Social Connections: Not on file  Intimate Partner Violence: Not on file    Physical Exam: Vital signs in last 24 hours: _0  (!) 109/51   Pulse (!) 48   Temp 97.7 F (36.5 C)   Ht 6' (1.829 m)   Wt 181 lb (82.1 kg)   SpO2 98%   BMI 24.55 kg/m  GEN: NAD EYE: Sclerae anicteric ENT: MMM CV: Non-tachycardic Pulm: CTA b/l GI: Soft, NT/ND NEURO:  Alert & Oriented x  [place], MD [facility]  11/27/2021 9:39 AM

## 2021-11-27 NOTE — Patient Instructions (Addendum)
Handouts Provided:  Polyps  - Patient has a contact number available for emergencies. The signs and symptoms of potential delayed complications were discussed with the patient. Return to normal activities tomorrow. Written discharge instructions were provided to the patient. - Resume previous diet. - Continue present medications. - Await pathology results. - Repeat colonoscopy is recommended for surveillance. The colonoscopy date will be determined after pathology results from today's exam become available for review.  YOU HAD AN ENDOSCOPIC PROCEDURE TODAY AT [facility]:   Refer to the procedure report that was given to you for any specific questions about what was found during the examination.  If the procedure report does not answer your questions, please call your gastroenterologist to clarify.  If you requested that your care partner not be given the details of your procedure findings, then the procedure report has been included in a sealed envelope for you to review at your convenience later.  YOU SHOULD EXPECT: Some feelings of bloating in the abdomen. Passage of more gas than usual.  Walking can help get rid of the air that was put into your GI tract during the procedure and reduce the bloating. If you had a lower endoscopy (such as a colonoscopy or flexible sigmoidoscopy) you may notice spotting of blood in your stool or on the toilet paper. If you underwent a bowel prep for your procedure, you may not have a normal bowel movement for a few days.  Please Note:  You might notice some irritation and congestion in your nose or some drainage.  This is from the oxygen used during your procedure.  There is no need for concern and it should clear up in a day or so.  SYMPTOMS TO REPORT IMMEDIATELY:  Following lower endoscopy (colonoscopy or flexible sigmoidoscopy):  Excessive amounts of blood in the stool  Significant tenderness or worsening of abdominal pains  Swelling of the  abdomen that is new, acute  Fever of 100F or higher  For urgent or emergent issues, a gastroenterologist can be reached at any hour by calling [phone]. Do not use MyChart messaging for urgent concerns.    DIET:  We do recommend a small meal at first, but then you may proceed to your regular diet.  Drink plenty of fluids but you should avoid alcoholic beverages for 24 hours.  ACTIVITY:  You should plan to take it easy for the rest of today and you should NOT DRIVE or use heavy machinery until tomorrow (because of the sedation medicines used during the test).    FOLLOW UP: Our staff will call the number listed on your records the next business day following your procedure.  We will call around 7:15- 8:00 am to check on you and address any questions or concerns that you may have regarding the information given to you following your procedure. If we do not reach you, we will leave a message.     If any biopsies were taken you will be contacted by phone or by letter within the next 1-3 weeks.  Please call us at [phone] if you have not heard about the biopsies in 3 weeks.    SIGNATURES/CONFIDENTIALITY: You and/or your care partner have signed paperwork which will be entered into your electronic medical record.  These signatures attest to the fact that that the information above on your After Visit Summary has been reviewed and is understood.  Full responsibility of the confidentiality of this discharge information lies with you and/or your  care-partner.  

## 2021-11-27 NOTE — Progress Notes (Signed)
Vital signs checked by:CW  The patient states no changes in medical or surgical history since pre-visit screening on 11/05/21.

## 2021-11-27 NOTE — Progress Notes (Signed)
Called to room to assist during endoscopic procedure.  Patient ID and intended procedure confirmed with present staff. Received instructions for my participation in the procedure from the performing physician.  

## 2021-11-28 ENCOUNTER — Telehealth: Payer: Self-pay

## 2021-11-28 NOTE — Telephone Encounter (Signed)
Follow up call placed, VM obtained and messge left

## 2021-12-05 ENCOUNTER — Encounter: Payer: Self-pay | Admitting: Internal Medicine

## 2022-01-11 ENCOUNTER — Ambulatory Visit
Admission: RE | Admit: 2022-01-11 | Discharge: 2022-01-11 | Disposition: A | Discharge status: Home / Self Care | Payer: No Typology Code available for payment source | Source: Ambulatory Visit | Attending: Internal Medicine | Admitting: Internal Medicine

## 2022-01-11 DIAGNOSIS — E785 Hyperlipidemia, unspecified: Secondary | ICD-10-CM
